# Patient Record
Sex: Female | Born: 1975 | Race: Black or African American | Hispanic: No | Marital: Married | Smoking: Never smoker
Health system: Southern US, Community
[De-identification: ages and names within clinical notes are randomized; demographics above are authoritative.]

## PROBLEM LIST (undated history)

## (undated) DIAGNOSIS — J45909 Unspecified asthma, uncomplicated: Secondary | ICD-10-CM

## (undated) DIAGNOSIS — T7840XA Allergy, unspecified, initial encounter: Secondary | ICD-10-CM

## (undated) HISTORY — PX: CHOLECYSTECTOMY: SHX55

## (undated) HISTORY — PX: TUBAL LIGATION: SHX77

## (undated) HISTORY — DX: Allergy, unspecified, initial encounter: T78.40XA

---

## 2014-04-25 HISTORY — PX: CHOLECYSTECTOMY: SHX55

## 2015-04-26 DIAGNOSIS — O24419 Gestational diabetes mellitus in pregnancy, unspecified control: Secondary | ICD-10-CM

## 2015-04-26 HISTORY — DX: Gestational diabetes mellitus in pregnancy, unspecified control: O24.419

## 2015-10-04 ENCOUNTER — Encounter (HOSPITAL_BASED_OUTPATIENT_CLINIC_OR_DEPARTMENT_OTHER): Payer: Self-pay | Admitting: *Deleted

## 2015-10-04 ENCOUNTER — Emergency Department (HOSPITAL_BASED_OUTPATIENT_CLINIC_OR_DEPARTMENT_OTHER)
Admission: EM | Admit: 2015-10-04 | Discharge: 2015-10-05 | Disposition: A | Discharge status: Home / Self Care | Payer: Medicaid Other | Attending: Emergency Medicine | Admitting: Emergency Medicine

## 2015-10-04 DIAGNOSIS — Z3A19 19 weeks gestation of pregnancy: Secondary | ICD-10-CM | POA: Insufficient documentation

## 2015-10-04 DIAGNOSIS — R51 Headache: Secondary | ICD-10-CM | POA: Insufficient documentation

## 2015-10-04 DIAGNOSIS — R42 Dizziness and giddiness: Secondary | ICD-10-CM | POA: Diagnosis not present

## 2015-10-04 DIAGNOSIS — R519 Headache, unspecified: Secondary | ICD-10-CM

## 2015-10-04 DIAGNOSIS — O26892 Other specified pregnancy related conditions, second trimester: Secondary | ICD-10-CM | POA: Insufficient documentation

## 2015-10-04 DIAGNOSIS — R110 Nausea: Secondary | ICD-10-CM | POA: Diagnosis not present

## 2015-10-04 MED ORDER — ACETAMINOPHEN 500 MG PO TABS
1000.0000 mg | ORAL_TABLET | Freq: Once | ORAL | Status: AC
  Administered 2015-10-05: 1000 mg via ORAL
  Filled 2015-10-04: qty 2

## 2015-10-04 MED ORDER — METOCLOPRAMIDE HCL 5 MG/ML IJ SOLN
10.0000 mg | Freq: Once | INTRAMUSCULAR | Status: AC
Start: 2015-10-04 — End: 2015-10-04
  Administered 2015-10-04: 10 mg via INTRAVENOUS
  Filled 2015-10-04: qty 2

## 2015-10-04 MED ORDER — DIPHENHYDRAMINE HCL 50 MG/ML IJ SOLN
25.0000 mg | Freq: Once | INTRAMUSCULAR | Status: AC
  Administered 2015-10-04: 25 mg via INTRAVENOUS
  Filled 2015-10-04: qty 1

## 2015-10-04 NOTE — ED Notes (Signed)
Pt reports HA intermittently x 1 week.  Pt is [redacted] weeks pregnant-healthy pregnancy.

## 2015-10-04 NOTE — ED Notes (Signed)
Pt is [redacted] weeks pregnant, no issues with this pregnancy at present time.

## 2015-10-04 NOTE — ED Provider Notes (Signed)
CSN: [id number]     Arrival date & time 10/04/15  1950 History  By signing my name below, I, Soijett Blue, attest that this documentation has been prepared under the direction and in the presence of Tilden Fossa, MD. Electronically Signed: Soijett Blue, ED Scribe. 10/04/2015. 9:45 PM.   Chief Complaint  Patient presents with  . Migraine      The history is provided by the patient. No language interpreter was used.    Courtney Medina is a 40 y.o. female who presents to the Emergency Department complaining of intermittent frontal HA onset 1 week worsening today. She notes that this HA is similar to HA that she has had when she was younger. Pt states that she is [redacted] weeks pregnant and that she has had no issues with her pregnancy besides nausea. Pt notes that this is her third pregnancy and she didn't have any issues with HA in her recent pregnancies. Pt is having associated symptoms of dizziness, blurred vision, photophobia, nausea due to pregnancy, and sinus pressure. She notes that she has tried tylenol with no relief of her symptoms. She denies fever, vomiting, vaginal bleeding, dysuria, nasal congestion, rhinorrhea, and any other symptoms.     History reviewed. No pertinent past medical history. Past Surgical History  Procedure Laterality Date  . Cholecystectomy     History reviewed. No pertinent family history. Social History  Substance Use Topics  . Smoking status: Never Smoker   . Smokeless tobacco: None  . Alcohol Use: No   OB History    Gravida Para Term Preterm AB TAB SAB Ectopic Multiple Living   1              Review of Systems  Constitutional: Negative for fever.  HENT: Positive for sinus pressure. Negative for congestion and rhinorrhea.   Eyes: Positive for photophobia and visual disturbance (blurred vision).  Gastrointestinal: Positive for nausea. Negative for vomiting.  Genitourinary: Negative for dysuria and vaginal bleeding.  Neurological: Positive for dizziness  and headaches.  All other systems reviewed and are negative.     Allergies  Penicillins and Zithromax  Home Medications   Prior to Admission medications   Not on File   BP 92/63 mmHg  Pulse 91  Temp(Src) 98.2 F (36.8 C) (Oral)  Resp 20  Ht  (1.473 m)  Wt 190 lb (86.183 kg)  BMI 39.72 kg/m2  SpO2 98% Physical Exam  Constitutional: She is oriented to person, place, and time. She appears well-developed and well-nourished.  HENT:  Head: Normocephalic and atraumatic.  Eyes: EOM are normal. Pupils are equal, round, and reactive to light.  photophobia  Cardiovascular: Normal rate and regular rhythm.   No murmur heard. Pulmonary/Chest: Effort normal and breath sounds normal. No respiratory distress.  Abdominal: Soft. There is no tenderness. There is no rebound and no guarding.  Musculoskeletal: She exhibits no edema or tenderness.  Neurological: She is alert and oriented to person, place, and time. No cranial nerve deficit.  Skin: Skin is warm and dry.  Psychiatric: She has a normal mood and affect. Her behavior is normal.  Nursing note and vitals reviewed.   ED Course  Procedures (including critical care time) DIAGNOSTIC STUDIES: Oxygen Saturation is 100% on RA, nl by my interpretation.    COORDINATION OF CARE: 9:44 PM Discussed treatment plan with pt at bedside which includes reglan and benadryl and pt agreed to plan.    Labs Review Labs Reviewed - No data to display  Imaging  Review No results found.    EKG Interpretation None      MDM   Final diagnoses:  Bad headache   Pt here for evaluation of headache.  She is nontoxic appearing on examination with no focal neurologic deficits.  Presentation not c/w SAH, CVA, meningitis.  She is partially improved following headache cocktail in the ED and declines additional treatment.  Plan to d/c home with outpatient follow up.  Return precautions discussed.   I personally performed the services described in  this documentation, which was scribed in my presence. The recorded information has been reviewed and is accurate.    Tilden FossaElizabeth Adhvik Canady, MD 10/05/15 1122

## 2015-10-04 NOTE — Discharge Instructions (Signed)

## 2015-10-05 NOTE — ED Notes (Signed)
Pt given d/c instructions as per chart. Verbalizes understanding. No questions. 

## 2019-02-26 ENCOUNTER — Emergency Department (HOSPITAL_BASED_OUTPATIENT_CLINIC_OR_DEPARTMENT_OTHER): Payer: Medicaid Other

## 2019-02-26 ENCOUNTER — Emergency Department (HOSPITAL_BASED_OUTPATIENT_CLINIC_OR_DEPARTMENT_OTHER)
Admission: EM | Admit: 2019-02-26 | Discharge: 2019-02-26 | Disposition: A | Discharge status: Home / Self Care | Payer: Medicaid Other | Attending: Emergency Medicine | Admitting: Emergency Medicine

## 2019-02-26 ENCOUNTER — Other Ambulatory Visit: Payer: Self-pay

## 2019-02-26 ENCOUNTER — Encounter (HOSPITAL_BASED_OUTPATIENT_CLINIC_OR_DEPARTMENT_OTHER): Payer: Self-pay

## 2019-02-26 DIAGNOSIS — R519 Headache, unspecified: Secondary | ICD-10-CM | POA: Insufficient documentation

## 2019-02-26 DIAGNOSIS — R42 Dizziness and giddiness: Secondary | ICD-10-CM | POA: Diagnosis not present

## 2019-02-26 DIAGNOSIS — R531 Weakness: Secondary | ICD-10-CM | POA: Diagnosis present

## 2019-02-26 DIAGNOSIS — J45909 Unspecified asthma, uncomplicated: Secondary | ICD-10-CM | POA: Diagnosis not present

## 2019-02-26 HISTORY — DX: Unspecified asthma, uncomplicated: J45.909

## 2019-02-26 LAB — CBC WITH DIFFERENTIAL/PLATELET
Abs Immature Granulocytes: 0.03 10*3/uL (ref 0.00–0.07)
Basophils Absolute: 0 10*3/uL (ref 0.0–0.1)
Basophils Relative: 0 %
Eosinophils Absolute: 0.2 10*3/uL (ref 0.0–0.5)
Eosinophils Relative: 2 %
HCT: 37.2 % (ref 36.0–46.0)
Hemoglobin: 11.3 g/dL — ABNORMAL LOW (ref 12.0–15.0)
Immature Granulocytes: 1 %
Lymphocytes Relative: 27 %
Lymphs Abs: 1.8 10*3/uL (ref 0.7–4.0)
MCH: 27.7 pg (ref 26.0–34.0)
MCHC: 30.4 g/dL (ref 30.0–36.0)
MCV: 91.2 fL (ref 80.0–100.0)
Monocytes Absolute: 0.5 10*3/uL (ref 0.1–1.0)
Monocytes Relative: 8 %
Neutro Abs: 4.1 10*3/uL (ref 1.7–7.7)
Neutrophils Relative %: 62 %
Platelets: 248 10*3/uL (ref 150–400)
RBC: 4.08 MIL/uL (ref 3.87–5.11)
RDW: 12.9 % (ref 11.5–15.5)
WBC: 6.6 10*3/uL (ref 4.0–10.5)
nRBC: 0 % (ref 0.0–0.2)

## 2019-02-26 LAB — BASIC METABOLIC PANEL
Anion gap: 9 (ref 5–15)
BUN: 8 mg/dL (ref 6–20)
CO2: 24 mmol/L (ref 22–32)
Calcium: 8.7 mg/dL — ABNORMAL LOW (ref 8.9–10.3)
Chloride: 105 mmol/L (ref 98–111)
Creatinine, Ser: 0.46 mg/dL (ref 0.44–1.00)
GFR calc Af Amer: >60 mL/min (ref >60)
GFR calc non Af Amer: >60 mL/min (ref >60)
Glucose, Bld: 97 mg/dL (ref 70–99)
Potassium: 3.5 mmol/L (ref 3.5–5.1)
Sodium: 138 mmol/L (ref 135–145)

## 2019-02-26 NOTE — ED Notes (Signed)
Pt lying flat for orthostatics 

## 2019-02-26 NOTE — ED Notes (Signed)
Pts IV in right AC pulled out by pt while she removed shirt.

## 2019-02-26 NOTE — ED Notes (Signed)
Patient ambulatory to CT

## 2019-02-26 NOTE — ED Notes (Signed)
ED Provider at bedside. 

## 2019-02-26 NOTE — ED Provider Notes (Signed)
[facility] EMERGENCY DEPARTMENT Provider Note   CSN: [id number] Arrival date & time: 02/26/19  1528     History   Chief Complaint Chief Complaint  Patient presents with  . Weakness    HPI Courtney Medina is a 43 y.o. female.     Patient is a 43 year old female with past medical history of asthma presenting to the emergency department for multiple complaints which have been going on for the past week.  Patient reports that she has been feeling some sort of odd sensation in the right side of her head and into her right arm.  Reports that she has been feeling weak in her right arm especially when she is trying to pick up her son.  Reports that occasionally she feels both lightheaded like she is going to pass out but also dizzy when she is trying to walk.  Reports she feels like her distance vision might be getting a little bit worse than it was before but denies any diplopia, flashes, floaters or vision loss.  Reports having a slight headache but mostly just in unpleasant sensation in the right side of her face.  Denies any slurred speech, dysphagia, facial asymmetry.  She notes that her father died of ALS at age 43.  She reports that she is not sure if she is having any symptoms in her legs or not.  She denies any cough, fever, shortness of breath, nausea, vomiting, diarrhea.     Past Medical History:  Diagnosis Date  . Asthma   . Gestational diabetes   . Gestational diabetes 2017    There are no active problems to display for this patient.   Past Surgical History:  Procedure Laterality Date  . CHOLECYSTECTOMY       OB History    Gravida  1   Para      Term      Preterm      AB      Living        SAB      TAB      Ectopic      Multiple      Live Births               Home Medications    Prior to Admission medications   Not on File    Family History History reviewed. No pertinent family history.  Social History Social History    Tobacco Use  . Smoking status: Never Smoker  . Smokeless tobacco: Never Used  Substance Use Topics  . Alcohol use: Yes    Comment: occ  . Drug use: No     Allergies   Penicillins and Zithromax [azithromycin]   Review of Systems Review of Systems  Constitutional: Positive for fatigue. Negative for appetite change and fever.  HENT: Negative for congestion and sore throat.   Eyes: Negative for photophobia, pain, redness and itching.  Respiratory: Negative for cough and shortness of breath.   Cardiovascular: Negative for chest pain, palpitations and leg swelling.  Gastrointestinal: Negative for abdominal pain, constipation and vomiting.  Genitourinary: Negative for dysuria, flank pain, menstrual problem and pelvic pain.  Musculoskeletal: Negative for back pain.  Skin: Negative for rash and wound.  Neurological: Positive for dizziness, weakness, light-headedness, numbness and headaches. Negative for tremors, seizures, syncope, facial asymmetry and speech difficulty.     Physical Exam Updated Vital Signs BP 108/69 (BP Location: Right Arm)   Pulse 84   Temp 98.9 F (37.2 C) (Oral)  Resp 14   Ht 4\' 10"  (1.473 m)   Wt 79.4 kg   SpO2 100%   Breastfeeding Unknown   BMI 36.58 kg/m   Physical Exam Vitals signs and nursing note reviewed.  Constitutional:      General: She is not in acute distress.    Appearance: Normal appearance. She is not ill-appearing or diaphoretic.  HENT:     Head: Normocephalic.     Nose: Nose normal.     Mouth/Throat:     Mouth: Mucous membranes are moist.  Eyes:     General: No visual field deficit.    Conjunctiva/sclera: Conjunctivae normal.  Cardiovascular:     Rate and Rhythm: Normal rate and regular rhythm.     Pulses: Normal pulses.  Pulmonary:     Effort: Pulmonary effort is normal.  Abdominal:     General: Abdomen is flat. Bowel sounds are normal.  Skin:    General: Skin is dry.  Neurological:     General: No focal deficit  present.     Mental Status: She is alert and oriented to person, place, and time.     Cranial Nerves: Cranial nerves are intact. No cranial nerve deficit, dysarthria or facial asymmetry.     Sensory: Sensation is intact. No sensory deficit.     Motor: Motor function is intact. No weakness, tremor, atrophy or pronator drift.     Coordination: Coordination normal. Finger-Nose-Finger Test and Heel to [place] Test normal.     Gait: Gait is intact. Gait and tandem walk normal.  Psychiatric:        Mood and Affect: Mood normal.      ED Treatments / Results  Labs (all labs ordered are listed, but only abnormal results are displayed) Labs Reviewed  CBC WITH DIFFERENTIAL/PLATELET - Abnormal; Notable for the following components:      Result Value   Hemoglobin 11.3 (*)    All other components within normal limits  BASIC METABOLIC PANEL - Abnormal; Notable for the following components:   Calcium 8.7 (*)    All other components within normal limits  URINALYSIS, ROUTINE W REFLEX MICROSCOPIC  PREGNANCY, URINE    EKG EKG Interpretation  Date/Time:  Tuesday February 26 2019 17:52:57 EST Ventricular Rate:  76 PR Interval:    QRS Duration: 92 QT Interval:  376 QTC Calculation: 423 R Axis:   29 Text Interpretation: Sinus rhythm Low voltage, precordial leads Confirmed by 03-15-1980 [phone]) on 02/26/2019 6:13:27 PM   Radiology Dg Chest 2 View  Result Date: 02/26/2019 CLINICAL DATA:  Weakness and dizziness for 1 week EXAM: CHEST - 2 VIEW COMPARISON:  None. FINDINGS: The cardiac silhouette, mediastinal and hilar contours are within normal limits. The lungs are clear. No pleural effusions. The bony thorax is intact. IMPRESSION: No acute cardiopulmonary findings. Electronically Signed   By: 13/06/2018 M.D.   On: 02/26/2019 17:55   Ct Head Wo Contrast  Result Date: 02/26/2019 CLINICAL DATA:  Head pressure radiating down the right arm EXAM: CT HEAD WITHOUT CONTRAST TECHNIQUE: Contiguous axial  images were obtained from the base of the skull through the vertex without intravenous contrast. COMPARISON:  None. FINDINGS: Brain: No evidence of acute infarction, hemorrhage, hydrocephalus, extra-axial collection or mass lesion/mass effect. Vascular: No hyperdense vessel or unexpected calcification. Skull: Normal. Negative for fracture or focal lesion. Sinuses/Orbits: No acute finding. Other: None IMPRESSION: Negative non contrasted CT appearance of the brain Electronically Signed   By: 13/06/2018 M.D.   On:  02/26/2019 17:43    Procedures Procedures (including critical care time)  Medications Ordered in ED Medications - No data to display   Initial Impression / Assessment and Plan / ED Course  I have reviewed the triage vital signs and the nursing notes.  Pertinent labs & imaging results that were available during my care of the patient were reviewed by me and considered in my medical decision making (see chart for details).  Clinical Course as of Feb 25 1854  Tue Feb 26, 2019  1852 Patient seen today for multiple complaints over the last week or so including headache, arm weakness, dizziness, lightheadedness.  Neuro exam today reveals normal gait and otherwise unremarkable findings.  Labs, EKG, orthostatics, head CT, chest x-ray are all unremarkable as well.  Discussed the case with Dr. Ronnald Nian who agrees patient okay for discharge with no need for further imaging in the emergency department at this time.  I do think that she should follow-up with neurology given she has had some of the symptoms in the past before.  Patient in agreement with the plan.   [KM]    Clinical Course User Index [KM] Alveria Apley, PA-C       Based on review of vitals, medical screening exam, lab work and/or imaging, there does not appear to be an acute, emergent etiology for the patient's symptoms. Counseled pt on good return precautions and encouraged both PCP and ED follow-up as needed.  Prior to  discharge, I also discussed incidental imaging findings with patient in detail and advised appropriate, recommended follow-up in detail.  Clinical Impression: 1. Weakness   2. Dizziness     Disposition: Discharge  Prior to providing a prescription for a controlled substance, I independently reviewed the patient's recent prescription history on the [facility]. The patient had no recent or regular prescriptions and was deemed appropriate for a brief, less than 3 day prescription of narcotic for acute analgesia.  This note was prepared with assistance of Systems analyst. Occasional wrong-word or sound-a-like substitutions may have occurred due to the inherent limitations of voice recognition software.   Final Clinical Impressions(s) / ED Diagnoses   Final diagnoses:  Weakness  Dizziness    ED Discharge Orders    None       Alveria Apley, PA-C 02/26/19 1855    Lennice Sites, DO 02/26/19 2048

## 2019-02-26 NOTE — ED Triage Notes (Signed)
Pt states head pressure that radiates down right arm, causing it to feel heavy and weak on and off x1week. Some dizziness and feelings of 'just being off', blurry vision, fell 3 xdays ago due to same. No headache, LOC, denies injury. A&Ox4.

## 2019-02-26 NOTE — Discharge Instructions (Addendum)
You are seen today for right-sided abnormal sensation and weakness.  Your lab work was reassuring.  Your head CT, chest x-ray and EKG were also reassuring.  I think that due to the symptoms ongoing and you having similar symptoms in the past you should get a follow-up with neurology for further work-up.  Otherwise you are safe to go home today and follow-up with your regular doctor as scheduled. Thank you for allowing me to care for you today. Please return to the emergency department if you have new or worsening symptoms. Take your medications as instructed.

## 2019-04-08 ENCOUNTER — Ambulatory Visit: Payer: Medicaid Other | Admitting: Neurology

## 2019-04-08 ENCOUNTER — Encounter: Payer: Self-pay | Admitting: *Deleted

## 2019-06-04 ENCOUNTER — Ambulatory Visit: Payer: Medicaid Other | Admitting: Diagnostic Neuroimaging

## 2019-06-04 ENCOUNTER — Other Ambulatory Visit: Payer: Self-pay

## 2019-06-04 ENCOUNTER — Encounter: Payer: Self-pay | Admitting: Diagnostic Neuroimaging

## 2019-06-04 VITALS — BP 128/90 | HR 88 | Temp 98.1°F | Ht <= 58 in | Wt 186.0 lb

## 2019-06-04 DIAGNOSIS — G4719 Other hypersomnia: Secondary | ICD-10-CM

## 2019-06-04 DIAGNOSIS — R531 Weakness: Secondary | ICD-10-CM | POA: Diagnosis not present

## 2019-06-04 NOTE — Progress Notes (Signed)
[facility]  PATIENT: Courtney Medina DOB: 1975-10-27  REFERRING CLINICIAN: Piedad Climes, [place] E, * HISTORY FROM: patient  REASON FOR VISIT: new consult    HISTORICAL  CHIEF COMPLAINT:  Chief Complaint  Patient presents with  . Ataxia, muscle weakness    rm 7 New Pt "muscle weakness x 3 months off and on, recent weight gain 30 lbs; I feel better now"    HISTORY OF PRESENT ILLNESS:   44 year old female here for evaluation of intermittent weakness, confusion and fatigue.  Patient has had intermittent episodes lasting for hours, days or weeks at a time of incoordination, right arm heaviness, confusion, fatigue.  Patient had an episode recently in October 2020 right before she went on vacation to [place].  First episodes occurred around age 43 years old.  Patient has had these intermittent episodes over the years.  She has had 2 neurologic evaluations in the past with negative work-up.  First work-up was in [place] and second work-up was in [facility].  Patient's father had diagnosis of ALS at age 22 years old and passed away at age 42 years old.  Patient was concerned about onset of similar symptoms however patient symptoms have always resolved after some time.  Patient denies any significant stress or sleep disturbances.    REVIEW OF SYSTEMS: Full 14 system review of systems performed and negative with exception of: As per HPI.  ALLERGIES: Allergies  Allergen Reactions  . Erythromycin Nausea Only  . Penicillins Rash    HOME MEDICATIONS: Outpatient Medications Prior to Visit  Medication Sig Dispense Refill  . amphetamine-dextroamphetamine (ADDERALL) 30 MG tablet Take 30 mg by mouth daily.    . cetirizine (ZYRTEC) 10 MG tablet Take 10 mg by mouth daily.    . diphenhydrAMINE (BENADRYL) 12.5 MG chewable tablet Chew 12.5 mg by mouth 4 (four) times daily as needed for allergies. 1-2 tabs as needed     No facility-administered medications prior  to visit.    PAST MEDICAL HISTORY: Past Medical History:  Diagnosis Date  . Allergies   . Asthma   . Gestational diabetes   . Gestational diabetes 2017    PAST SURGICAL HISTORY: Past Surgical History:  Procedure Laterality Date  . CESAREAN SECTION     x 3  . CHOLECYSTECTOMY  2016    FAMILY HISTORY: Family History  Problem Relation Age of Onset  . Breast cancer Mother   . Asthma Mother   . Eczema Mother   . Allergies Mother   . ALS Father   . Diabetes Other   . Heart disease Other   . Hyperlipidemia Other   . Hypertension Other   . Allergic rhinitis Other   . Asthma Other   . Eczema Other   . Urticaria Other   . Allergies Other   . Breast cancer Maternal Aunt   . Allergic rhinitis Child   . Asthma Child   . Eczema Child   . Allergies Child   . Cancer Maternal Grandfather   . Emphysema Paternal Grandmother   . Diabetes Paternal Grandfather   . Heart disease Paternal Grandfather     SOCIAL HISTORY: Social History   Socioeconomic History  . Marital status: Married    Spouse name: Loraine Leriche  . Number of children: 3  . Years of education: Not on file  . 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  Smoking status: Never Smoker  . Smokeless tobacco: Never Used  Substance and Sexual Activity  . Alcohol use: Yes    Comment: occ  . Drug use: No  . Sexual activity: Not on file  Other Topics Concern  . Not on file  Social History Narrative   Lives with family   Caffeine- coffee 1 daily   Social Determinants of Health   Financial Resource Strain:   . Difficulty of Paying Living Expenses: Not on file  Food Insecurity:   . Worried About Programme researcher, broadcasting/film/video in the Last Year: Not on file  . Ran Out of Food in the Last Year: Not on file  Transportation Needs:   . Lack of Transportation (Medical): Not on file  . Lack of Transportation (Non-Medical): Not on file    Physical Activity:   . Days of Exercise per Week: Not on file  . Minutes of Exercise per Session: Not on file  Stress:   . Feeling of Stress : Not on file  Social Connections:   . Frequency of Communication with Friends and Family: Not on file  . Frequency of Social Gatherings with Friends and Family: Not on file  . Attends Religious Services: Not on file  . Active Member of Clubs or Organizations: Not on file  . Attends Banker Meetings: Not on file  . Marital Status: Not on file  Intimate Partner Violence:   . Fear of Current or Ex-Partner: Not on file  . Emotionally Abused: Not on file  . Physically Abused: Not on file  . Sexually Abused: Not on file     PHYSICAL EXAM  GENERAL EXAM/CONSTITUTIONAL: Vitals:  Vitals:   06/04/19 1541  BP: 128/90  Pulse: 88  Temp: 98.1 F (36.7 C)  Weight: 186 lb (84.4 kg)  Height: 4\' 10"  (1.473 m)     Body mass index is 38.87 kg/m. Wt Readings from Last 3 Encounters:  06/04/19 186 lb (84.4 kg)  02/26/19 175 lb (79.4 kg)  10/04/15 190 lb (86.2 kg)     Patient is in no distress; well developed, nourished and groomed; neck is supple  CARDIOVASCULAR:  Examination of carotid arteries is normal; no carotid bruits  Regular rate and rhythm, no murmurs  Examination of peripheral vascular system by observation and palpation is normal  EYES:  Ophthalmoscopic exam of optic discs and posterior segments is normal; no papilledema or hemorrhages  No exam data present  MUSCULOSKELETAL:  Gait, strength, tone, movements noted in Neurologic exam below  NEUROLOGIC: MENTAL STATUS:  No flowsheet data found.  awake, alert, oriented to person, place and time  recent and remote memory intact  normal attention and concentration  language fluent, comprehension intact, naming intact  fund of knowledge appropriate  CRANIAL NERVE:   2nd - no papilledema on fundoscopic exam  2nd, 3rd, 4th, 6th - pupils equal and  reactive to light, visual fields full to confrontation, extraocular muscles intact, no nystagmus  5th - facial sensation symmetric  7th - facial strength symmetric  8th - hearing intact  9th - palate elevates symmetrically, uvula midline  11th - shoulder shrug symmetric  12th - tongue protrusion midline  MOTOR:   normal bulk and tone, full strength in the BUE, BLE  SENSORY:   normal and symmetric to light touch, temperature, vibration  COORDINATION:   finger-nose-finger, fine finger movements normal  REFLEXES:   deep tendon reflexes present and symmetric  GAIT/STATION:   narrow based gait  DIAGNOSTIC DATA (LABS, IMAGING, TESTING) - I reviewed patient records, labs, notes, testing and imaging myself where available.  Lab Results  Component Value Date   WBC 6.6 02/26/2019   HGB 11.3 (L) 02/26/2019   HCT 37.2 02/26/2019   MCV 91.2 02/26/2019   PLT 248 02/26/2019      Component Value Date/Time   NA 138 02/26/2019 1711   K 3.5 02/26/2019 1711   CL 105 02/26/2019 1711   CO2 24 02/26/2019 1711   GLUCOSE 97 02/26/2019 1711   BUN 8 02/26/2019 1711   CREATININE 0.46 02/26/2019 1711   CALCIUM 8.7 (L) 02/26/2019 1711   GFRNONAA >60 02/26/2019 1711   GFRAA >60 02/26/2019 1711   No results found for: CHOL, HDL, LDLCALC, LDLDIRECT, TRIG, CHOLHDL No results found for: HGBA1C No results found for: VITAMINB12 No results found for: TSH   03/19/19 MRI brain  - normal    ASSESSMENT AND PLAN  44 y.o. year old female here with intermittent episodes of weakness, fatigue, confusion, heaviness on the right side, since age 66 years old, with episodes lasting days or weeks at a time and fully resolving.  We will proceed with further work-up to rule out other metabolic, autoimmune, inflammatory etiologies.  Also will set up a sleep study to rule out OSA or other parasomnia.   Dx:  1. Weakness   2. Excessive daytime sleepiness     PLAN:  INTERMITTENT WEAKNESS,  FATIGUE, CONFUSION - check labs and MRI cervical spine - check sleep study  Orders Placed This Encounter  Procedures  . MR CERVICAL SPINE W WO CONTRAST  . AChR Abs with Reflex to MuSK  . Neuromyelitis optica autoab, IgG  . CBC with Differential/Platelet  . Comprehensive metabolic panel  . Hemoglobin A1c  . Vitamin B12  . CK  . Aldolase  . Ambulatory referral to Sleep Studies   Return for pending if symptoms worsen or fail to improve.    Penni Bombard, MD 0/12/9831, 8:25 PM Certified in Neurology, Neurophysiology and Neuroimaging  [facility] [address] [place], [place] [address] [phone] a

## 2019-06-05 ENCOUNTER — Telehealth: Payer: Self-pay | Admitting: Diagnostic Neuroimaging

## 2019-06-05 NOTE — Telephone Encounter (Signed)
medicaid order sent to [facility]. They will obtain the auth and reach out to the patient to schedule.  °

## 2019-06-13 ENCOUNTER — Institutional Professional Consult (permissible substitution): Payer: Medicaid Other | Admitting: Neurology

## 2019-06-19 LAB — CBC WITH DIFFERENTIAL/PLATELET
Basophils Absolute: 0 10*3/uL (ref 0.0–0.2)
Basos: 1 %
EOS (ABSOLUTE): 0.2 10*3/uL (ref 0.0–0.4)
Eos: 3 %
Hematocrit: 35.2 % (ref 34.0–46.6)
Hemoglobin: 11.5 g/dL (ref 11.1–15.9)
Immature Grans (Abs): 0 10*3/uL (ref 0.0–0.1)
Immature Granulocytes: 0 %
Lymphocytes Absolute: 2.5 10*3/uL (ref 0.7–3.1)
Lymphs: 34 %
MCH: 28 pg (ref 26.6–33.0)
MCHC: 32.7 g/dL (ref 31.5–35.7)
MCV: 86 fL (ref 79–97)
Monocytes Absolute: 0.6 10*3/uL (ref 0.1–0.9)
Monocytes: 8 %
Neutrophils Absolute: 4 10*3/uL (ref 1.4–7.0)
Neutrophils: 54 %
Platelets: 246 10*3/uL (ref 150–450)
RBC: 4.1 x10E6/uL (ref 3.77–5.28)
RDW: 12.4 % (ref 11.7–15.4)
WBC: 7.3 10*3/uL (ref 3.4–10.8)

## 2019-06-19 LAB — COMPREHENSIVE METABOLIC PANEL
ALT: 8 IU/L (ref 0–32)
AST: 11 IU/L (ref 0–40)
Albumin/Globulin Ratio: 1.4 (ref 1.2–2.2)
Albumin: 3.9 g/dL (ref 3.8–4.8)
Alkaline Phosphatase: 120 IU/L — ABNORMAL HIGH (ref 39–117)
BUN/Creatinine Ratio: 13 (ref 9–23)
BUN: 7 mg/dL (ref 6–24)
Bilirubin Total: <0.2 mg/dL (ref 0.0–1.2)
CO2: 24 mmol/L (ref 20–29)
Calcium: 9.5 mg/dL (ref 8.7–10.2)
Chloride: 103 mmol/L (ref 96–106)
Creatinine, Ser: 0.56 mg/dL — ABNORMAL LOW (ref 0.57–1.00)
GFR calc Af Amer: 132 mL/min/{1.73_m2} (ref >59)
GFR calc non Af Amer: 115 mL/min/{1.73_m2} (ref >59)
Globulin, Total: 2.8 g/dL (ref 1.5–4.5)
Glucose: 100 mg/dL — ABNORMAL HIGH (ref 65–99)
Potassium: 4.3 mmol/L (ref 3.5–5.2)
Sodium: 141 mmol/L (ref 134–144)
Total Protein: 6.7 g/dL (ref 6.0–8.5)

## 2019-06-19 LAB — VITAMIN B12: Vitamin B-12: 372 pg/mL (ref 232–1245)

## 2019-06-19 LAB — HEMOGLOBIN A1C
Est. average glucose Bld gHb Est-mCnc: 117 mg/dL
Hgb A1c MFr Bld: 5.7 % — ABNORMAL HIGH (ref 4.8–5.6)

## 2019-06-19 LAB — CK: Total CK: 76 U/L (ref 32–182)

## 2019-06-19 LAB — MUSK ANTIBODIES: MuSK Antibodies: <1 U/mL

## 2019-06-19 LAB — ACHR ABS WITH REFLEX TO MUSK: AChR Binding Ab, Serum: 0.04 nmol/L (ref 0.00–0.24)

## 2019-06-19 LAB — ALDOLASE: Aldolase: 3.5 U/L (ref 3.3–10.3)

## 2019-06-19 LAB — NEUROMYELITIS OPTICA AUTOAB, IGG: NMO IgG Autoantibodies: <1.5 U/mL (ref 0.0–3.0)

## 2019-06-20 ENCOUNTER — Ambulatory Visit: Payer: Medicaid Other | Admitting: Neurology

## 2019-06-20 ENCOUNTER — Other Ambulatory Visit: Payer: Self-pay

## 2019-06-20 ENCOUNTER — Encounter: Payer: Self-pay | Admitting: Neurology

## 2019-06-20 VITALS — BP 116/80 | HR 97 | Temp 97.7°F | Ht <= 58 in | Wt 181.0 lb

## 2019-06-20 DIAGNOSIS — R0683 Snoring: Secondary | ICD-10-CM

## 2019-06-20 DIAGNOSIS — G4719 Other hypersomnia: Secondary | ICD-10-CM | POA: Diagnosis not present

## 2019-06-20 DIAGNOSIS — G4733 Obstructive sleep apnea (adult) (pediatric): Secondary | ICD-10-CM

## 2019-06-20 DIAGNOSIS — E669 Obesity, unspecified: Secondary | ICD-10-CM

## 2019-06-20 DIAGNOSIS — R519 Headache, unspecified: Secondary | ICD-10-CM

## 2019-06-20 NOTE — Patient Instructions (Signed)

## 2019-06-20 NOTE — Progress Notes (Signed)
Subjective:    Patient ID: Courtney Medina is a 44 y.o. female.  HPI     Huston Foley, MD, PhD [facility] [address] [address] [place], [place] [address]   Dear Satira Sark,   I saw your patient, Courtney Medina, upon your kind request in my sleep clinic today for initial consultation of her sleep disorder, in particular, concern for underlying obstructive sleep apnea.  The patient is unaccompanied today.  As you know, Courtney Medina is a 44 year old right-handed woman with an underlying medical history of allergies, asthma, and obesity, who reports daytime somnolence. I reviewed the office note from 06/04/2019.  Her Epworth sleepiness score is 9 out of 24, fatigue severity score is 31 out of 63.  She reports that she had sleep study testing on 2 occasions in the past but was never advised to start CPAP therapy.  One study was in [place] and [place] in [place].  Her last study in 2014 and neurology did show some sleep apnea but she believes it was mild and she had the option of trying CPAP.  She has gained weight in the realm of 30 pounds in the past year since the pandemic she admits.  She has been working on weight loss.  She works as Printmaker.  She has 44-year-old who cosleeps with her, her husband often sleeps in a different bedroom.  She has an 77-year-old and a 44 year old as well.  She has a 72 year old stepson that stays with him partially.  She is not aware of any family history of OSA.  Her father has ALS, her mother does snore.  She has woken up with a headache at times, she does not have night to night nocturia, she has to be up at 430, she has a 35-minute commute one way, bedtime is around 830.  Her husband has noted her snoring.  She has had some shortness of breath at times.  She drinks caffeine in the form of coffee, 1 cup/day on average, occasional tea, no soda, she does not smoke, she drinks alcohol occasionally. Her Past Medical History Is  Significant For: Past Medical History:  Diagnosis Date  . Allergies   . Asthma   . Gestational diabetes   . Gestational diabetes 2017    Her Past Surgical History Is Significant For: Past Surgical History:  Procedure Laterality Date  . CESAREAN SECTION     x 3  . CHOLECYSTECTOMY  2016    Her Family History Is Significant For: Family History  Problem Relation Age of Onset  . Breast cancer Mother   . Asthma Mother   . Eczema Mother   . Allergies Mother   . ALS Father   . Diabetes Other   . Heart disease Other   . Hyperlipidemia Other   . Hypertension Other   . Allergic rhinitis Other   . Asthma Other   . Eczema Other   . Urticaria Other   . Allergies Other   . Breast cancer Maternal Aunt   . Allergic rhinitis Child   . Asthma Child   . Eczema Child   . Allergies Child   . Cancer Maternal Grandfather   . Emphysema Paternal Grandmother   . Diabetes Paternal Grandfather   . Heart disease Paternal Grandfather     Her Social History Is Significant For: Social History   Socioeconomic History  . Marital status: Married    Spouse name: Loraine Leriche  . Number of children: 3  .  Years of education: Not on file  . 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 Smoking status: Never Smoker  . Smokeless tobacco: Never Used  Substance and Sexual Activity  . Alcohol use: Yes    Comment: occ  . Drug use: No  . Sexual activity: Not on file  Other Topics Concern  . Not on file  Social History Narrative   Lives with family   Caffeine- coffee 1 daily   Social Determinants of Health   Financial Resource Strain:   . Difficulty of Paying Living Expenses: Not on file  Food Insecurity:   . Worried About Charity fundraiser in the Last Year: Not on file  . Ran Out of Food in the Last Year: Not on file  Transportation Needs:   . Lack of Transportation (Medical): Not on file  .  Lack of Transportation (Non-Medical): Not on file  Physical Activity:   . Days of Exercise per Week: Not on file  . Minutes of Exercise per Session: Not on file  Stress:   . Feeling of Stress : Not on file  Social Connections:   . Frequency of Communication with Friends and Family: Not on file  . Frequency of Social Gatherings with Friends and Family: Not on file  . Attends Religious Services: Not on file  . Active Member of Clubs or Organizations: Not on file  . Attends Archivist Meetings: Not on file  . Marital Status: Not on file    Her Allergies Are:  Allergies  Allergen Reactions  . Erythromycin Nausea Only  . Penicillins Rash  :   Her Current Medications Are:  Outpatient Encounter Medications as of 06/20/2019  Medication Sig  . amphetamine-dextroamphetamine (ADDERALL) 30 MG tablet Take 30 mg by mouth daily.  . cetirizine (ZYRTEC) 10 MG tablet Take 10 mg by mouth daily.  . diphenhydrAMINE (BENADRYL) 12.5 MG chewable tablet Chew 12.5 mg by mouth 4 (four) times daily as needed for allergies. 1-2 tabs as needed   No facility-administered encounter medications on file as of 06/20/2019.  :  Review of Systems:  Out of a complete 14 point review of systems, all are reviewed and negative with the exception of these symptoms as listed below:  Review of Systems  Neurological:       Here for sleep study consult. Per pt 2 prior sleep studies at that point no pap was recommended. Epworth Sleepiness Scale 0= would never doze 1= slight chance of dozing 2= moderate chance of dozing 3= high chance of dozing  Sitting and reading:2 Watching TV:3 Sitting inactive in a public place (ex. Theater or meeting):1 As a passenger in a car for an hour without a break:3 Lying down to rest in the afternoon:0 Sitting and talking to someone:0 Sitting quietly after lunch (no alcohol):0 In a car, while stopped in traffic:0 Total:9     Objective:  Neurological Exam  Physical  Exam Physical Examination:   Vitals:   06/20/19 1603  BP: 116/80  Pulse: 97  Temp: 97.7 F (36.5 C)  SpO2: 98%    General Examination: The patient is a very pleasant 44 y.o. female in no acute distress. She appears well-developed and well-nourished and well groomed.   HEENT: Normocephalic, atraumatic, pupils are equal, round and reactive to light, extraocular tracking is good without limitation to gaze excursion or nystagmus noted. Hearing is grossly  intact. Face is symmetric with normal facial animation. Speech is clear with no dysarthria noted. There is no hypophonia. There is no lip, neck/head, jaw or voice tremor. Neck is supple with full range of passive and active motion. There are no carotid bruits on auscultation. Oropharynx exam reveals: mild mouth dryness, adequate dental hygiene and mild airway crowding, due to Small airway entry, uvula is slim but slightly elongated, tonsils small, neck circumference 14-1/8 inches, Mallampati class I.  Tongue protrudes centrally in palate elevates symmetrically.   Chest: Clear to auscultation without wheezing, rhonchi or crackles noted.  Heart: S1+S2+0, regular and normal without murmurs, rubs or gallops noted.   Abdomen: Soft, non-tender and non-distended with normal bowel sounds appreciated on auscultation.  Extremities: There is no pitting edema in the distal lower extremities bilaterally.   Skin: Warm and dry without trophic changes noted.   Musculoskeletal: exam reveals no obvious joint deformities, tenderness or joint swelling or erythema.   Neurologically:  Mental status: The patient is awake, alert and oriented in all 4 spheres. Her immediate and remote memory, attention, language skills and fund of knowledge are appropriate. There is no evidence of aphasia, agnosia, apraxia or anomia. Speech is clear with normal prosody and enunciation. Thought process is linear. Mood is normal and affect is normal.  Cranial nerves II - XII are as  described above under HEENT exam.  Motor exam: Normal bulk, strength and tone is noted. There is no tremor, Fine motor skills and coordination: grossly intact.  Cerebellar testing: No dysmetria or intention tremor. There is no truncal or gait ataxia.  Sensory exam: intact to light touch in the upper and lower extremities.  Gait, station and balance: She stands easily. No veering to one side is noted. No leaning to one side is noted. Posture is age-appropriate and stance is narrow based. Gait shows normal stride length and normal pace. No problems turning are noted.   Assessment and Plan:  In summary, Yulisa Chirico is a very pleasant 44 y.o.-year old female with an underlying medical history of allergies, asthma, and obesity, whose history and physical exam are concerning for obstructive sleep apnea (OSA). I had a long chat with the patient about my findings and the diagnosis of OSA, its prognosis and treatment options. We talked about medical treatments, surgical interventions and non-pharmacological approaches. I explained in particular the risks and ramifications of untreated moderate to severe OSA, especially with respect to developing cardiovascular disease down the Road, including congestive heart failure, difficult to treat hypertension, cardiac arrhythmias, or stroke. Even type 2 diabetes has, in part, been linked to untreated OSA. Symptoms of untreated OSA include daytime sleepiness, memory problems, mood irritability and mood disorder such as depression and anxiety, lack of energy, as well as recurrent headaches, especially morning headaches. We talked about trying to maintain a healthy lifestyle in general, as well as the importance of weight control. We also talked about the importance of good sleep hygiene. I recommended the following at this time: sleep study.  I explained the sleep test procedure to the patient and also outlined possible surgical and non-surgical treatment options of OSA,  including the use of a custom-made dental device (which would require a referral to a specialist dentist or oral surgeon), upper airway surgical options, such as traditional UPPP or a novel less invasive surgical option in the form of Inspire hypoglossal nerve stimulation (which would involve a referral to an ENT surgeon). I also explained the CPAP treatment option to the patient,  who indicated that she would be willing to try CPAP if the need arises. I explained the importance of being compliant with PAP treatment, not only for insurance purposes but primarily to improve Her symptoms, and for the patient's long term health benefit, including to reduce Her cardiovascular risks. I answered all her questions today and the patient was in agreement. I plan to see her back after the sleep study is completed and encouraged her to call with any interim questions, concerns, problems or updates.   Thank you very much for allowing me to participate in the care of this nice patient. If I can be of any further assistance to you please do not hesitate to talk to me.  Sincerely,   Huston Foley, MD, PhD

## 2019-06-24 ENCOUNTER — Telehealth: Payer: Self-pay | Admitting: *Deleted

## 2019-06-24 NOTE — Telephone Encounter (Signed)
Have attempted to reach patient x 3. There is no ring on her side of call.

## 2019-06-25 NOTE — Telephone Encounter (Signed)
Attempted to reach patient again on only # provided. Message: "your call can't be completed at this time, try your call again later."

## 2019-06-26 ENCOUNTER — Encounter: Payer: Self-pay | Admitting: *Deleted

## 2019-06-26 NOTE — Telephone Encounter (Signed)
Unable to LVM x 3,  result letter mailed.

## 2019-07-08 ENCOUNTER — Telehealth: Payer: Self-pay

## 2019-07-08 NOTE — Telephone Encounter (Signed)
We have attempted to call the patient two times to schedule sleep study.  Patient has been unavailable at the phone numbers we have on file and has not returned our calls. If patient calls back we will schedule them for their sleep study.  

## 2019-10-04 ENCOUNTER — Emergency Department (HOSPITAL_BASED_OUTPATIENT_CLINIC_OR_DEPARTMENT_OTHER): Payer: BC Managed Care – PPO

## 2019-10-04 ENCOUNTER — Other Ambulatory Visit: Payer: Self-pay

## 2019-10-04 ENCOUNTER — Emergency Department (HOSPITAL_BASED_OUTPATIENT_CLINIC_OR_DEPARTMENT_OTHER)
Admission: EM | Admit: 2019-10-04 | Discharge: 2019-10-04 | Disposition: A | Discharge status: Home / Self Care | Payer: BC Managed Care – PPO | Attending: Emergency Medicine | Admitting: Emergency Medicine

## 2019-10-04 ENCOUNTER — Encounter (HOSPITAL_BASED_OUTPATIENT_CLINIC_OR_DEPARTMENT_OTHER): Payer: Self-pay | Admitting: Emergency Medicine

## 2019-10-04 DIAGNOSIS — Y999 Unspecified external cause status: Secondary | ICD-10-CM | POA: Insufficient documentation

## 2019-10-04 DIAGNOSIS — R10814 Left lower quadrant abdominal tenderness: Secondary | ICD-10-CM | POA: Insufficient documentation

## 2019-10-04 DIAGNOSIS — R10812 Left upper quadrant abdominal tenderness: Secondary | ICD-10-CM | POA: Diagnosis not present

## 2019-10-04 DIAGNOSIS — Y92410 Unspecified street and highway as the place of occurrence of the external cause: Secondary | ICD-10-CM | POA: Insufficient documentation

## 2019-10-04 DIAGNOSIS — R519 Headache, unspecified: Secondary | ICD-10-CM | POA: Diagnosis present

## 2019-10-04 DIAGNOSIS — M545 Low back pain: Secondary | ICD-10-CM | POA: Diagnosis not present

## 2019-10-04 DIAGNOSIS — S060X0A Concussion without loss of consciousness, initial encounter: Secondary | ICD-10-CM | POA: Insufficient documentation

## 2019-10-04 DIAGNOSIS — M62830 Muscle spasm of back: Secondary | ICD-10-CM | POA: Diagnosis not present

## 2019-10-04 DIAGNOSIS — E119 Type 2 diabetes mellitus without complications: Secondary | ICD-10-CM | POA: Diagnosis not present

## 2019-10-04 DIAGNOSIS — Y9389 Activity, other specified: Secondary | ICD-10-CM | POA: Insufficient documentation

## 2019-10-04 DIAGNOSIS — J45909 Unspecified asthma, uncomplicated: Secondary | ICD-10-CM | POA: Diagnosis not present

## 2019-10-04 LAB — CBC WITH DIFFERENTIAL/PLATELET
Abs Immature Granulocytes: 0.02 10*3/uL (ref 0.00–0.07)
Basophils Absolute: 0 10*3/uL (ref 0.0–0.1)
Basophils Relative: 1 %
Eosinophils Absolute: 0.2 10*3/uL (ref 0.0–0.5)
Eosinophils Relative: 3 %
HCT: 37.7 % (ref 36.0–46.0)
Hemoglobin: 11.7 g/dL — ABNORMAL LOW (ref 12.0–15.0)
Immature Granulocytes: 0 %
Lymphocytes Relative: 28 %
Lymphs Abs: 1.4 10*3/uL (ref 0.7–4.0)
MCH: 27.3 pg (ref 26.0–34.0)
MCHC: 31 g/dL (ref 30.0–36.0)
MCV: 88.1 fL (ref 80.0–100.0)
Monocytes Absolute: 0.4 10*3/uL (ref 0.1–1.0)
Monocytes Relative: 9 %
Neutro Abs: 3 10*3/uL (ref 1.7–7.7)
Neutrophils Relative %: 59 %
Platelets: 264 10*3/uL (ref 150–400)
RBC: 4.28 MIL/uL (ref 3.87–5.11)
RDW: 13.1 % (ref 11.5–15.5)
WBC: 5 10*3/uL (ref 4.0–10.5)
nRBC: 0 % (ref 0.0–0.2)

## 2019-10-04 LAB — PREGNANCY, URINE: Preg Test, Ur: NEGATIVE

## 2019-10-04 LAB — COMPREHENSIVE METABOLIC PANEL
ALT: 12 U/L (ref 0–44)
AST: 14 U/L — ABNORMAL LOW (ref 15–41)
Albumin: 3.5 g/dL (ref 3.5–5.0)
Alkaline Phosphatase: 95 U/L (ref 38–126)
Anion gap: 9 (ref 5–15)
BUN: 10 mg/dL (ref 6–20)
CO2: 25 mmol/L (ref 22–32)
Calcium: 8.4 mg/dL — ABNORMAL LOW (ref 8.9–10.3)
Chloride: 104 mmol/L (ref 98–111)
Creatinine, Ser: 0.54 mg/dL (ref 0.44–1.00)
GFR calc Af Amer: >60 mL/min (ref >60)
GFR calc non Af Amer: >60 mL/min (ref >60)
Glucose, Bld: 101 mg/dL — ABNORMAL HIGH (ref 70–99)
Potassium: 3.8 mmol/L (ref 3.5–5.1)
Sodium: 138 mmol/L (ref 135–145)
Total Bilirubin: 0.5 mg/dL (ref 0.3–1.2)
Total Protein: 7 g/dL (ref 6.5–8.1)

## 2019-10-04 LAB — CBG MONITORING, ED: Glucose-Capillary: 102 mg/dL — ABNORMAL HIGH (ref 70–99)

## 2019-10-04 MED ORDER — IOHEXOL 300 MG/ML  SOLN
100.0000 mL | Freq: Once | INTRAMUSCULAR | Status: AC | PRN
  Administered 2019-10-04: 100 mL via INTRAVENOUS

## 2019-10-04 MED ORDER — DIPHENHYDRAMINE HCL 50 MG/ML IJ SOLN
25.0000 mg | Freq: Once | INTRAMUSCULAR | Status: AC
  Administered 2019-10-04: 25 mg via INTRAVENOUS
  Filled 2019-10-04: qty 1

## 2019-10-04 MED ORDER — ACETAMINOPHEN 325 MG PO TABS
650.0000 mg | ORAL_TABLET | Freq: Once | ORAL | Status: AC
  Administered 2019-10-04: 650 mg via ORAL
  Filled 2019-10-04: qty 2

## 2019-10-04 MED ORDER — PROCHLORPERAZINE EDISYLATE 10 MG/2ML IJ SOLN
10.0000 mg | Freq: Once | INTRAMUSCULAR | Status: AC
  Administered 2019-10-04: 10 mg via INTRAVENOUS
  Filled 2019-10-04: qty 2

## 2019-10-04 MED ORDER — KETOROLAC TROMETHAMINE 15 MG/ML IJ SOLN
15.0000 mg | Freq: Once | INTRAMUSCULAR | Status: AC
  Administered 2019-10-04: 15 mg via INTRAVENOUS
  Filled 2019-10-04: qty 1

## 2019-10-04 MED ORDER — SODIUM CHLORIDE 0.9 % IV BOLUS
500.0000 mL | Freq: Once | INTRAVENOUS | Status: AC
  Administered 2019-10-04: 500 mL via INTRAVENOUS
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
## 2019-10-04 NOTE — ED Notes (Signed)
Pt placed on bedpan, pt very sleepy hubby at bedside

## 2019-10-04 NOTE — Discharge Instructions (Signed)
If you develop continued, recurrent, or worsening headache, fever, neck stiffness, vomiting, blurry or double vision, weakness or numbness in your arms or legs, trouble speaking, or any other new/concerning symptoms then return to the ER for evaluation.  

## 2019-10-04 NOTE — ED Notes (Signed)
CT awaiting results from [facility] and pregnancy test prior to scanning, per radiology protocol;

## 2020-07-02 ENCOUNTER — Encounter (HOSPITAL_BASED_OUTPATIENT_CLINIC_OR_DEPARTMENT_OTHER): Payer: Self-pay

## 2020-07-02 ENCOUNTER — Other Ambulatory Visit: Payer: Self-pay

## 2020-07-02 ENCOUNTER — Emergency Department (HOSPITAL_BASED_OUTPATIENT_CLINIC_OR_DEPARTMENT_OTHER)
Admission: EM | Admit: 2020-07-02 | Discharge: 2020-07-02 | Disposition: A | Discharge status: Home / Self Care | Payer: BC Managed Care – PPO | Attending: Emergency Medicine | Admitting: Emergency Medicine

## 2020-07-02 ENCOUNTER — Emergency Department (HOSPITAL_BASED_OUTPATIENT_CLINIC_OR_DEPARTMENT_OTHER): Payer: BC Managed Care – PPO

## 2020-07-02 DIAGNOSIS — Z7951 Long term (current) use of inhaled steroids: Secondary | ICD-10-CM | POA: Diagnosis not present

## 2020-07-02 DIAGNOSIS — J45909 Unspecified asthma, uncomplicated: Secondary | ICD-10-CM | POA: Diagnosis not present

## 2020-07-02 DIAGNOSIS — J019 Acute sinusitis, unspecified: Secondary | ICD-10-CM | POA: Insufficient documentation

## 2020-07-02 DIAGNOSIS — R059 Cough, unspecified: Secondary | ICD-10-CM | POA: Diagnosis present

## 2020-07-02 MED ORDER — GUAIFENESIN ER 600 MG PO TB12
600.0000 mg | ORAL_TABLET | Freq: Two times a day (BID) | ORAL | 0 refills | Status: DC
Start: 2020-07-02 — End: 2020-07-02

## 2020-07-02 MED ORDER — GUAIFENESIN ER 600 MG PO TB12: 600.0000 mg | ORAL_TABLET | Freq: Two times a day (BID) | ORAL | 0 refills | Status: DC

## 2020-07-02 MED ORDER — AEROCHAMBER PLUS FLO-VU LARGE MISC
1.0000 | Freq: Once | Status: DC
  Filled 2020-07-02: qty 1

## 2020-07-02 MED ORDER — FLUTICASONE PROPIONATE 50 MCG/ACT NA SUSP
2.0000 | Freq: Every day | NASAL | 0 refills | Status: AC
Start: 2020-07-02 — End: ?

## 2020-07-02 MED ORDER — DOXYCYCLINE HYCLATE 100 MG PO CAPS
100.0000 mg | ORAL_CAPSULE | Freq: Two times a day (BID) | ORAL | 0 refills | Status: AC
Start: 2020-07-02 — End: 2020-07-09

## 2020-07-02 MED ORDER — ALBUTEROL SULFATE HFA 108 (90 BASE) MCG/ACT IN AERS
2.0000 | INHALATION_SPRAY | Freq: Once | RESPIRATORY_TRACT | Status: AC
  Administered 2020-07-02: 2 via RESPIRATORY_TRACT
  Filled 2020-07-02: qty 6.7

## 2020-07-02 NOTE — ED Provider Notes (Signed)
[facility] EMERGENCY DEPARTMENT Provider Note   CSN: [id number] Arrival date & time: 07/02/20  1916     History Chief Complaint  Patient presents with  . Cough    Courtney Medina is a 45 y.o. female.  HPI   45 year old female with a history of allergies, asthma, gestational diabetes, who presents to the emergency department today for evaluation of URI symptoms.  States she has had a cough for about 2 weeks.  She was seen at urgent care and tested for Covid strep and flu which were all negative.  She was given cough medication which seemed to improve symptoms and she took some time off work however when she had returned to work her symptoms recurred and now she is coughing, lost her voice and has worse nasal congestion, sinus pressure and a headache.  She is also having sweats and chills.  She has been taking Sudafed without relief.  Past Medical History:  Diagnosis Date  . Allergies   . Asthma   . Gestational diabetes   . Gestational diabetes 2017    There are no problems to display for this patient.   Past Surgical History:  Procedure Laterality Date  . CESAREAN SECTION     x 3  . CHOLECYSTECTOMY  2016  . TUBAL LIGATION       OB History    Gravida  1   Para      Term      Preterm      AB      Living        SAB      IAB      Ectopic      Multiple      Live Births              Family History  Problem Relation Age of Onset  . Breast cancer Mother   . Asthma Mother   . Eczema Mother   . Allergies Mother   . ALS Father   . Diabetes Other   . Heart disease Other   . Hyperlipidemia Other   . Hypertension Other   . Allergic rhinitis Other   . Asthma Other   . Eczema Other   . Urticaria Other   . Allergies Other   . Breast cancer Maternal Aunt   . Allergic rhinitis Child   . Asthma Child   . Eczema Child   . Allergies Child   . Cancer Maternal Grandfather   . Emphysema Paternal Grandmother   . Diabetes Paternal Grandfather    . Heart disease Paternal Grandfather     Social History   Tobacco Use  . Smoking status: Never Smoker  . Smokeless tobacco: Never Used  Substance Use Topics  . Alcohol use: Yes    Comment: occ  . Drug use: No    Home Medications Prior to Admission medications   Medication Sig Start Date End Date Taking? Authorizing Provider  doxycycline (VIBRAMYCIN) 100 MG capsule Take 1 capsule (100 mg total) by mouth 2 (two) times daily for 7 days. 07/02/20 07/09/20 Yes Couture, Cortni S, PA-C  fluticasone (FLONASE) 50 MCG/ACT nasal spray Place 2 sprays into both nostrils daily. 07/02/20  Yes Couture, Cortni S, PA-C  amphetamine-dextroamphetamine (ADDERALL) 30 MG tablet Take 30 mg by mouth daily.    [provider]  cetirizine (ZYRTEC) 10 MG tablet Take 10 mg by mouth daily.    [provider]  diphenhydrAMINE (BENADRYL) 12.5 MG chewable tablet Chew 12.5  mg by mouth 4 (four) times daily as needed for allergies. 1-2 tabs as needed    [provider]  guaiFENesin (MUCINEX) 600 MG 12 hr tablet Take 1 tablet (600 mg total) by mouth 2 (two) times daily. 07/02/20   Couture, Cortni S, PA-C    Allergies    Erythromycin and Penicillins  Review of Systems   Review of Systems  Constitutional: Positive for chills and diaphoresis.  HENT: Positive for congestion, rhinorrhea, sinus pressure, sinus pain and voice change. Negative for ear pain and sore throat.   Eyes: Negative for pain and visual disturbance.  Respiratory: Positive for cough. Negative for shortness of breath.   Cardiovascular: Negative for chest pain and palpitations.  Gastrointestinal: Negative for abdominal pain, constipation, diarrhea, nausea and vomiting.  Genitourinary: Negative for dysuria and hematuria.  Musculoskeletal: Negative for back pain.  Skin: Negative for rash.  Neurological: Positive for headaches. Negative for seizures and syncope.  All other systems reviewed and are negative.   Physical  Exam Updated Vital Signs BP (!) 157/80 (BP Location: Left Arm)   Pulse 99   Temp 97.6 F (36.4 C) (Oral)   Resp 20   Ht 4\' 10"  (1.473 m)   Wt 78 kg   LMP 07/02/2020   SpO2 100%   BMI 35.95 kg/m   Physical Exam Vitals and nursing note reviewed.  Constitutional:      General: She is not in acute distress.    Appearance: She is well-developed.  HENT:     Head: Normocephalic and atraumatic.     Nose:     Comments: Nasal turbinates are swollen bilaterally    Mouth/Throat:     Pharynx: No oropharyngeal exudate or posterior oropharyngeal erythema.     Tonsils: No tonsillar exudate or tonsillar abscesses. 0 on the right. 0 on the left.  Eyes:     Conjunctiva/sclera: Conjunctivae normal.  Cardiovascular:     Rate and Rhythm: Normal rate and regular rhythm.     Heart sounds: Normal heart sounds. No murmur heard.   Pulmonary:     Effort: Pulmonary effort is normal. No respiratory distress.     Breath sounds: Normal breath sounds. No wheezing, rhonchi or rales.  Abdominal:     General: Bowel sounds are normal.     Palpations: Abdomen is soft.     Tenderness: There is no abdominal tenderness. There is no guarding or rebound.  Musculoskeletal:     Cervical back: Neck supple.  Skin:    General: Skin is warm and dry.  Neurological:     Mental Status: She is alert.     ED Results / Procedures / Treatments   Labs (all labs ordered are listed, but only abnormal results are displayed) Labs Reviewed - No data to display  EKG None  Radiology DG Chest Portable 1 View  Result Date: 07/02/2020 CLINICAL DATA:  Cough, flu like symptoms for 2 weeks EXAM: PORTABLE CHEST 1 VIEW COMPARISON:  Radiograph 11/03/2019 FINDINGS: No consolidation, features of edema, pneumothorax, or effusion. Pulmonary vascularity is normally distributed. The cardiomediastinal contours are unremarkable. No acute osseous or soft tissue abnormality. IMPRESSION: No acute cardiopulmonary abnormality. Electronically  Signed   By: 01/04/2020 M.D.   On: 07/02/2020 21:01    Procedures Procedures   Medications Ordered in ED Medications  AeroChamber Plus Flo-Vu Large MISC 1 each (has no administration in time range)  albuterol (VENTOLIN HFA) 108 (90 Base) MCG/ACT inhaler 2 puff (2 puffs Inhalation Given 07/02/20 2031)  ED Course  I have reviewed the triage vital signs and the nursing notes.  Pertinent labs & imaging results that were available during my care of the patient were reviewed by me and considered in my medical decision making (see chart for details).    MDM Rules/Calculators/A&P                          45 year old female presenting the emergency department today for evaluation of URI symptoms.  Ongoing for 2 weeks.  Had improvement of symptoms initially but they worsened a few days ago.  Now complaining of chills, increased nasal congestion with purulent drainage and per persistent cough.  Chest x-ray reviewed/interpreted - * No acute cardiopulmonary abnormality  Patient has already had Covid strep and flu test.  Do not feel that repeat testing is indicated at this time.  We will treat for sinus infection with fluticasone, Mucinex and Augmentin. Will also give albuterol. She was given a dose here in the ED and sxs improved.  We will have her follow-up with her PCP and return to the ED for new or worsening symptoms.  She voiced understanding plan and reasons to return. All Questions answered.  Patient stable for discharge.   Final Clinical Impression(s) / ED Diagnoses Final diagnoses:  Acute sinusitis, recurrence not specified, unspecified location    Rx / DC Orders ED Discharge Orders         Ordered    guaiFENesin (MUCINEX) 600 MG 12 hr tablet  2 times daily,   Status:  Discontinued        07/02/20 2153    fluticasone (FLONASE) 50 MCG/ACT nasal spray  Daily        07/02/20 2153    doxycycline (VIBRAMYCIN) 100 MG capsule  2 times daily        07/02/20 2153    guaiFENesin  (MUCINEX) 600 MG 12 hr tablet  2 times daily        07/02/20 2155           Karrie Meres, PA-C 07/02/20 2330    Charlynne Pander, MD 07/02/20 2351

## 2020-07-02 NOTE — Discharge Instructions (Addendum)
Use 2 puffs of the albuterol inhaler every 4-6 hours as needed for cough or shortness of breath.  Use the Mucinex, Flonase and doxycycline as directed.  Do not take doxycycline if pregnant or breastfeeding  Please follow up with your primary care provider within 5-7 days for re-evaluation of your symptoms. If you do not have a primary care provider, information for a healthcare clinic has been provided for you to make arrangements for follow up care. Please return to the emergency department for any new or worsening symptoms.

## 2020-07-02 NOTE — ED Triage Notes (Signed)
Pt c/o flu like sx x 2 weeks-states she was last week with neg covid/neg strep-NAD-steady gait

## 2021-02-03 ENCOUNTER — Emergency Department (HOSPITAL_BASED_OUTPATIENT_CLINIC_OR_DEPARTMENT_OTHER)
Admission: EM | Admit: 2021-02-03 | Discharge: 2021-02-04 | Disposition: A | Discharge status: Home / Self Care | Payer: BC Managed Care – PPO | Attending: Emergency Medicine | Admitting: Emergency Medicine

## 2021-02-03 ENCOUNTER — Encounter (HOSPITAL_BASED_OUTPATIENT_CLINIC_OR_DEPARTMENT_OTHER): Payer: Self-pay | Admitting: Emergency Medicine

## 2021-02-03 ENCOUNTER — Other Ambulatory Visit: Payer: Self-pay

## 2021-02-03 DIAGNOSIS — R1084 Generalized abdominal pain: Secondary | ICD-10-CM | POA: Diagnosis not present

## 2021-02-03 DIAGNOSIS — J45909 Unspecified asthma, uncomplicated: Secondary | ICD-10-CM | POA: Diagnosis not present

## 2021-02-03 DIAGNOSIS — R110 Nausea: Secondary | ICD-10-CM | POA: Insufficient documentation

## 2021-02-03 DIAGNOSIS — R059 Cough, unspecified: Secondary | ICD-10-CM | POA: Diagnosis not present

## 2021-02-03 DIAGNOSIS — R1013 Epigastric pain: Secondary | ICD-10-CM

## 2021-02-03 DIAGNOSIS — R109 Unspecified abdominal pain: Secondary | ICD-10-CM | POA: Diagnosis present

## 2021-02-03 DIAGNOSIS — Z20822 Contact with and (suspected) exposure to covid-19: Secondary | ICD-10-CM | POA: Insufficient documentation

## 2021-02-03 NOTE — ED Triage Notes (Signed)
Pt reports sharp abd pain with nausea and loose stools since this morning. Pt also states her head feels abnormal and feels like she can't get her words out.

## 2021-02-03 NOTE — ED Provider Notes (Signed)
[facility] EMERGENCY DEPARTMENT Provider Note   CSN: [id number] Arrival date & time: 02/03/21  2303     History Chief Complaint  Patient presents with   Abdominal Pain    Courtney Medina is a 45 y.o. female.  The history is provided by the patient and medical records.  Abdominal Pain Courtney Medina is a 45 y.o. female who presents to the Emergency Department complaining of abdominal pain.  She presents to the ED accompanied by her husband for evaluation of sharp, epigastric abdominal pain that started this morning.  She also feels strange mentally with difficulty processing her thoughts.  Feels SOB at times.  Has mild cough.  Stared metformin 2-3 weeks ago, 500 mg once daily. She was started on this medication for prediabetes and weight control. She is status post tubal ligation and cholecystectomy. LMP was on Sunday. Drinks occasional alcohol, last drink was several days ago. 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  No fever, vomiting.  Unable to eat today.  Has nausea.  Had loose stools for the last few days.  No dysuria.       Past Medical History:  Diagnosis Date   Allergies    Asthma    Gestational diabetes    Gestational diabetes 2017    There are no problems to display for this patient.   Past Surgical History:  Procedure Laterality Date   CESAREAN SECTION     x 3   CHOLECYSTECTOMY  2016   TUBAL LIGATION       OB History     Gravida  1   Para      Term      Preterm      AB      Living         SAB      IAB      Ectopic      Multiple      Live Births              Family History  Problem Relation Age of Onset   Breast cancer Mother    Asthma Mother    Eczema Mother    Allergies Mother    ALS Father    Diabetes Other    Heart disease Other    Hyperlipidemia Other    Hypertension Other    Allergic rhinitis Other    Asthma Other    Eczema Other    Urticaria Other    Allergies Other    Breast  cancer Maternal Aunt    Allergic rhinitis Child    Asthma Child    Eczema Child    Allergies Child    Cancer Maternal Grandfather    Emphysema Paternal Grandmother    Diabetes Paternal Grandfather    Heart disease Paternal Grandfather     Social History   Tobacco Use   Smoking status: Never   Smokeless tobacco: Never  Substance Use Topics   Alcohol use: Yes    Comment: occ   Drug use: No    Home Medications Prior to Admission medications   Medication Sig Start Date End Date Taking? Authorizing Provider  dicyclomine (BENTYL) 20 MG tablet Take 1 tablet (20 mg total) by mouth 2 (two) times daily as needed for spasms. 02/04/21  Yes Tilden Fossa, MD  ondansetron (ZOFRAN ODT) 4 MG disintegrating tablet Take 1 tablet (4 mg total) by mouth every 8 (eight) hours as needed for nausea or vomiting. 02/04/21  Yes Tilden Fossa, MD  amphetamine-dextroamphetamine (ADDERALL) 30 MG tablet Take 30 mg by mouth daily.    [provider]  cetirizine (ZYRTEC) 10 MG tablet Take 10 mg by mouth daily.    [provider]  diphenhydrAMINE (BENADRYL) 12.5 MG chewable tablet Chew 12.5 mg by mouth 4 (four) times daily as needed for allergies. 1-2 tabs as needed    [provider]  fluticasone (FLONASE) 50 MCG/ACT nasal spray Place 2 sprays into both nostrils daily. 07/02/20   Couture, Cortni S, PA-C  guaiFENesin (MUCINEX) 600 MG 12 hr tablet Take 1 tablet (600 mg total) by mouth 2 (two) times daily. 07/02/20   Couture, Cortni S, PA-C    Allergies    Erythromycin and Penicillins  Review of Systems   Review of Systems  Gastrointestinal:  Positive for abdominal pain.  All other systems reviewed and are negative.  Physical Exam Updated Vital Signs BP 103/73 (BP Location: Left Arm)   Pulse 61   Temp 98.1 F (36.7 C) (Oral)   Resp 18   Ht 4' 9.25" (1.454 m)   Wt 85.7 kg   LMP 01/27/2021 (Exact Date)   SpO2 100%   BMI 40.54 kg/m   Physical Exam Vitals and nursing  note reviewed.  Constitutional:      Appearance: She is well-developed.  HENT:     Head: Normocephalic and atraumatic.  Cardiovascular:     Rate and Rhythm: Normal rate and regular rhythm.  Pulmonary:     Effort: Pulmonary effort is normal. No respiratory distress.  Abdominal:     Palpations: Abdomen is soft.     Tenderness: There is no guarding or rebound.     Comments: Mild generalized abdominal tenderness  Musculoskeletal:        General: No tenderness.  Skin:    General: Skin is warm and dry.  Neurological:     Mental Status: She is alert and oriented to person, place, and time.     Comments: No asymmetry of facial movements. Visual fields grossly intact. Five out of five strength in all four extremities with sensation to light touch intact in all four extremities  Psychiatric:        Behavior: Behavior normal.    ED Results / Procedures / Treatments   Labs (all labs ordered are listed, but only abnormal results are displayed) Labs Reviewed  COMPREHENSIVE METABOLIC PANEL - Abnormal; Notable for the following components:      Result Value   Sodium 134 (*)    Glucose, Bld 107 (*)    Calcium 8.6 (*)    All other components within normal limits  CBC WITH DIFFERENTIAL/PLATELET - Abnormal; Notable for the following components:   Hemoglobin 11.2 (*)    HCT 35.4 (*)    All other components within normal limits  URINALYSIS, ROUTINE W REFLEX MICROSCOPIC - Abnormal; Notable for the following components:   Color, Urine STRAW (*)    All other components within normal limits  RESP PANEL BY RT-PCR (FLU A&B, COVID) ARPGX2  LIPASE, BLOOD  PREGNANCY, URINE  TROPONIN I (HIGH SENSITIVITY)    EKG EKG Interpretation  Date/Time:  Thursday February 04 2021 01:14:02 EDT Ventricular Rate:  61 PR Interval:  151 QRS Duration: 134 QT Interval:  431 QTC Calculation: 435 R Axis:   8 Text Interpretation: Sinus rhythm Nonspecific intraventricular conduction delay Nonspecific T  abnormalities, anterior leads Confirmed by Tilden Fossa [phone]) on 02/04/2021 1:23:44 AM  Radiology DG Chest 2 View  Result  Date: 02/04/2021 CLINICAL DATA:  Shortness of breath. Abdominal pain. Nausea, and loose stools. EXAM: CHEST - 2 VIEW COMPARISON:  07/02/2020 FINDINGS: The heart size and mediastinal contours are within normal limits. Both lungs are clear. The visualized skeletal structures are unremarkable. IMPRESSION: No active cardiopulmonary disease. Electronically Signed   By: Burman Nieves M.D.   On: 02/04/2021 01:30    Procedures Procedures   Medications Ordered in ED Medications  ondansetron (ZOFRAN) injection 4 mg (4 mg Intravenous Given 02/04/21 0021)  dicyclomine (BENTYL) capsule 10 mg (10 mg Oral Given 02/04/21 0010)  sodium chloride 0.9 % bolus 500 mL ( Intravenous Stopped 02/04/21 0200)    ED Course  I have reviewed the triage vital signs and the nursing notes.  Pertinent labs & imaging results that were available during my care of the patient were reviewed by me and considered in my medical decision making (see chart for details).    MDM Rules/Calculators/A&P                          patient here for evaluation of abdominal pain and feeling of and wellness that started today. Overall her abdominal pain has improved but she still feels unwell with difficult to describe symptoms. She is mild tenderness on examination without peritoneal findings. She has a normal neurologic examination. EKG is abnormal but similar when compared to priors. Current presentation is not consistent with CVA, ACS, dissection, PE, pancreatitis, appendicitis. Discussed with patient unclear source of symptoms, question viral illness. Discussed importance of outpatient follow-up and return precautions.  Final Clinical Impression(s) / ED Diagnoses Final diagnoses:  Epigastric pain    Rx / DC Orders ED Discharge Orders          Ordered    ondansetron (ZOFRAN ODT) 4 MG disintegrating  tablet  Every 8 hours PRN        02/04/21 0209    dicyclomine (BENTYL) 20 MG tablet  2 times daily PRN        02/04/21 0209             Tilden Fossa, MD 02/04/21 [phone]

## 2021-02-04 ENCOUNTER — Emergency Department (HOSPITAL_BASED_OUTPATIENT_CLINIC_OR_DEPARTMENT_OTHER): Payer: BC Managed Care – PPO

## 2021-02-04 DIAGNOSIS — R1084 Generalized abdominal pain: Secondary | ICD-10-CM | POA: Diagnosis not present

## 2021-02-04 LAB — URINALYSIS, ROUTINE W REFLEX MICROSCOPIC
Bilirubin Urine: NEGATIVE
Glucose, UA: NEGATIVE mg/dL
Hgb urine dipstick: NEGATIVE
Ketones, ur: NEGATIVE mg/dL
Leukocytes,Ua: NEGATIVE
Nitrite: NEGATIVE
Protein, ur: NEGATIVE mg/dL
Specific Gravity, Urine: 1.005 (ref 1.005–1.030)
pH: 6 (ref 5.0–8.0)

## 2021-02-04 LAB — COMPREHENSIVE METABOLIC PANEL
ALT: 14 U/L (ref 0–44)
AST: 17 U/L (ref 15–41)
Albumin: 3.7 g/dL (ref 3.5–5.0)
Alkaline Phosphatase: 105 U/L (ref 38–126)
Anion gap: 7 (ref 5–15)
BUN: 11 mg/dL (ref 6–20)
CO2: 24 mmol/L (ref 22–32)
Calcium: 8.6 mg/dL — ABNORMAL LOW (ref 8.9–10.3)
Chloride: 103 mmol/L (ref 98–111)
Creatinine, Ser: 0.57 mg/dL (ref 0.44–1.00)
GFR, Estimated: >60 mL/min (ref >60)
Glucose, Bld: 107 mg/dL — ABNORMAL HIGH (ref 70–99)
Potassium: 3.6 mmol/L (ref 3.5–5.1)
Sodium: 134 mmol/L — ABNORMAL LOW (ref 135–145)
Total Bilirubin: 0.5 mg/dL (ref 0.3–1.2)
Total Protein: 7.1 g/dL (ref 6.5–8.1)

## 2021-02-04 LAB — CBC WITH DIFFERENTIAL/PLATELET
Abs Immature Granulocytes: 0.02 10*3/uL (ref 0.00–0.07)
Basophils Absolute: 0 10*3/uL (ref 0.0–0.1)
Basophils Relative: 1 %
Eosinophils Absolute: 0.2 10*3/uL (ref 0.0–0.5)
Eosinophils Relative: 2 %
HCT: 35.4 % — ABNORMAL LOW (ref 36.0–46.0)
Hemoglobin: 11.2 g/dL — ABNORMAL LOW (ref 12.0–15.0)
Immature Granulocytes: 0 %
Lymphocytes Relative: 32 %
Lymphs Abs: 2.4 10*3/uL (ref 0.7–4.0)
MCH: 27.3 pg (ref 26.0–34.0)
MCHC: 31.6 g/dL (ref 30.0–36.0)
MCV: 86.3 fL (ref 80.0–100.0)
Monocytes Absolute: 0.6 10*3/uL (ref 0.1–1.0)
Monocytes Relative: 8 %
Neutro Abs: 4.3 10*3/uL (ref 1.7–7.7)
Neutrophils Relative %: 57 %
Platelets: 272 10*3/uL (ref 150–400)
RBC: 4.1 MIL/uL (ref 3.87–5.11)
RDW: 13.2 % (ref 11.5–15.5)
WBC: 7.5 10*3/uL (ref 4.0–10.5)
nRBC: 0 % (ref 0.0–0.2)

## 2021-02-04 LAB — PREGNANCY, URINE: Preg Test, Ur: NEGATIVE

## 2021-02-04 LAB — LIPASE, BLOOD: Lipase: 23 U/L (ref 11–51)

## 2021-02-04 LAB — RESP PANEL BY RT-PCR (FLU A&B, COVID) ARPGX2
Influenza A by PCR: NEGATIVE
Influenza B by PCR: NEGATIVE
SARS Coronavirus 2 by RT PCR: NEGATIVE

## 2021-02-04 LAB — TROPONIN I (HIGH SENSITIVITY): Troponin I (High Sensitivity): <2 ng/L (ref <18)

## 2021-02-04 MED ORDER — DICYCLOMINE HCL 10 MG PO CAPS
10.0000 mg | ORAL_CAPSULE | Freq: Once | ORAL | Status: AC
  Administered 2021-02-04: 10 mg via ORAL
  Filled 2021-02-04: qty 1

## 2021-02-04 MED ORDER — DICYCLOMINE HCL 20 MG PO TABS: 20.0000 mg | ORAL_TABLET | Freq: Two times a day (BID) | ORAL | 0 refills | Status: DC | PRN

## 2021-02-04 MED ORDER — ONDANSETRON HCL 4 MG/2ML IJ SOLN
4.0000 mg | Freq: Once | INTRAMUSCULAR | Status: AC
  Administered 2021-02-04: 4 mg via INTRAVENOUS
  Filled 2021-02-04: qty 2

## 2021-02-04 MED ORDER — ONDANSETRON 4 MG PO TBDP: 4.0000 mg | ORAL_TABLET | Freq: Three times a day (TID) | ORAL | 0 refills | Status: DC | PRN

## 2021-02-04 MED ORDER — SODIUM CHLORIDE 0.9 % IV BOLUS
500.0000 mL | Freq: Once | INTRAVENOUS | Status: AC
  Administered 2021-02-04: 500 mL via INTRAVENOUS

## 2021-02-04 NOTE — ED Notes (Signed)
Patient transported to X-ray 

## 2021-08-13 ENCOUNTER — Ambulatory Visit (HOSPITAL_COMMUNITY)
Admission: EM | Admit: 2021-08-13 | Discharge: 2021-08-13 | Disposition: A | Discharge status: Home / Self Care | Payer: Medicaid Other | Attending: Psychiatry | Admitting: Psychiatry

## 2021-08-13 DIAGNOSIS — Z7984 Long term (current) use of oral hypoglycemic drugs: Secondary | ICD-10-CM | POA: Insufficient documentation

## 2021-08-13 DIAGNOSIS — F410 Panic disorder [episodic paroxysmal anxiety] without agoraphobia: Secondary | ICD-10-CM

## 2021-08-13 DIAGNOSIS — R7303 Prediabetes: Secondary | ICD-10-CM | POA: Insufficient documentation

## 2021-08-13 DIAGNOSIS — F331 Major depressive disorder, recurrent, moderate: Secondary | ICD-10-CM

## 2021-08-13 DIAGNOSIS — F411 Generalized anxiety disorder: Secondary | ICD-10-CM

## 2021-08-13 DIAGNOSIS — F909 Attention-deficit hyperactivity disorder, unspecified type: Secondary | ICD-10-CM | POA: Insufficient documentation

## 2021-08-13 DIAGNOSIS — Z880 Allergy status to penicillin: Secondary | ICD-10-CM | POA: Insufficient documentation

## 2021-08-13 DIAGNOSIS — Z881 Allergy status to other antibiotic agents status: Secondary | ICD-10-CM | POA: Insufficient documentation

## 2021-08-13 DIAGNOSIS — Z818 Family history of other mental and behavioral disorders: Secondary | ICD-10-CM | POA: Insufficient documentation

## 2021-08-13 MED ORDER — HYDROXYZINE HCL 50 MG PO TABS: 50.0000 mg | ORAL_TABLET | Freq: Three times a day (TID) | ORAL | 0 refills | Status: AC | PRN

## 2021-08-13 MED ORDER — HYDROXYZINE HCL 25 MG PO TABS
50.0000 mg | ORAL_TABLET | Freq: Three times a day (TID) | ORAL | Status: DC | PRN
  Filled 2021-08-13: qty 20
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
## 2021-08-13 NOTE — Discharge Instructions (Signed)
?  Please come to [facility] (this facility) during walk in hours for appointment with psychiatrist/provider for further medication management and for therapists for therapy.  ? ? Walk-Ins for medication management  are available on Monday, Wednesday, Thursday and Friday from 8am-11am.  It is first come, first -serve; it is best to arrive by 7:00 AM.  ? ? Walk-Ins for therapy are  available on Monday and Wednesday?s  8am-11am.  It is first come, first -serve; it is best to arrive by 7:00 AM.  ? ? ?When you arrive please go upstairs for your appointment. If you are unsure of where to go, inform the front desk that you are here for a walk in appointment and they will assist you with directions upstairs. ? ?Address:  ?[address], in [place], [address] ?Ph: [phone]  ? ? ? ? ? ?

## 2021-10-12 ENCOUNTER — Ambulatory Visit (INDEPENDENT_AMBULATORY_CARE_PROVIDER_SITE_OTHER): Payer: BC Managed Care – PPO | Admitting: Neurology

## 2021-10-12 ENCOUNTER — Encounter: Payer: Self-pay | Admitting: Neurology

## 2021-10-12 VITALS — BP 121/79 | HR 79 | Ht <= 58 in | Wt 192.0 lb

## 2021-10-12 DIAGNOSIS — R519 Headache, unspecified: Secondary | ICD-10-CM | POA: Diagnosis not present

## 2021-10-12 DIAGNOSIS — R0683 Snoring: Secondary | ICD-10-CM | POA: Diagnosis not present

## 2021-10-12 DIAGNOSIS — G4719 Other hypersomnia: Secondary | ICD-10-CM

## 2021-10-12 DIAGNOSIS — R635 Abnormal weight gain: Secondary | ICD-10-CM

## 2021-10-12 DIAGNOSIS — Z820 Family history of epilepsy and other diseases of the nervous system: Secondary | ICD-10-CM

## 2021-10-12 NOTE — Patient Instructions (Signed)
It was nice to see you again today. As discussed, we will proceed with sleep testing.  We will ask your insurance for authorization for testing. As explained, an attended sleep study (meaning you get to stay overnight in the sleep lab), lets [place] monitor sleep-related behaviors such as sleep talking and leg movements in sleep, in addition to monitoring for sleep apnea.  A home sleep test is a screening tool for sleep apnea diagnosis only, but unfortunately, does not help with any other sleep-related diagnoses.  Please remember, the long-term risks and ramifications of untreated moderate to severe obstructive sleep apnea may include (but are not limited to): increased risk for cardiovascular disease, including congestive heart failure, stroke, difficult to control hypertension, treatment resistant obesity, arrhythmias, especially irregular heartbeat commonly known as A. Fib. (atrial fibrillation); even type 2 diabetes has been linked to untreated OSA.   Other correlations that untreated obstructive sleep apnea include macular edema which is swelling of the retina in the eyes, droopy eyelid syndrome, and elevated hemoglobin and hematocrit levels (often referred to as polycythemia).  Sleep apnea can cause disruption of sleep and sleep deprivation in most cases, which, in turn, can cause recurrent headaches, problems with memory, mood, concentration, focus, and vigilance. Most people with untreated sleep apnea report excessive daytime sleepiness, which can affect their ability to drive. Please do not drive or use heavy equipment or machinery, if you feel sleepy! Patients with sleep apnea can also develop difficulty initiating and maintaining sleep (aka insomnia).   Having sleep apnea may increase your risk for other sleep disorders, including involuntary behaviors sleep such as sleep terrors, sleep talking, sleepwalking.    Having sleep apnea can also increase your risk for restless leg syndrome and leg  movements at night.   Please note that untreated obstructive sleep apnea may carry additional perioperative morbidity. Patients with significant obstructive sleep apnea (typically, in the moderate to severe degree) should receive, if possible, perioperative PAP (positive airway pressure) therapy and the surgeons and particularly the anesthesiologists should be informed of the diagnosis and the severity of the sleep disordered breathing.   We will call you or email you through MyChart with regards to your test results and plan a follow-up in sleep clinic accordingly. Most likely, you will hear from one of our nurses.   Our sleep lab administrative assistant will call you to schedule your sleep study and give you further instructions, regarding the check in process for the sleep study, arrival time, what to bring, when you can expect to leave after the study, etc., and to answer any other logistical questions you may have. If you don't hear back from her by about 2 weeks from now, please feel free to call her direct line at [phone] or you can call our general clinic number, or email [place] through My Chart.

## 2021-10-12 NOTE — Progress Notes (Signed)
Subjective:    Patient ID: Courtney Medina is a 46 y.o. female.  HPI       Interim history:   Courtney Medina (prev. Courtney Medina) is a 46 year old right-handed woman with an underlying medical history of allergies, asthma, and obesity, who presents for evaluation of her sleep disturbance.  I had evaluated her over 2 years ago at the request of Dr. Marjory Lies for her daytime somnolence and concern for sleep apnea.  She had a prior diagnosis of OSA and was advised to proceed with sleep testing.  She did not pursue sleep testing at the time.  Today, 10/12/2021: She reports that her snoring has become worse.  She has had weight changes and was able to lose weight, down into the 150s even but then gained weight back.  She does wake up with a headache fairly frequently.  She also reports a history of migraines.  Her father had sleep apnea, he also had ALS.  She has not made a follow-up appointment with Dr. Marjory Lies, she has had intermittent weakness in her shoulder girdle.  It does not happen all the time and is typically not related to sleeping in a certain position.  She goes to bed around 8 or 9 PM and rise time is around 4.  She works as a Runner, broadcasting/film/video, lives at home with her husband and 3 children.  She snores loudly per family.  Years ago she had sleep testing which showed mild sleep apnea but she was never on CPAP therapy.  She would be willing to consider PAP therapy at this time.  She would like to work harder on weight loss.  She has not made a follow-up appointment with her primary care to discuss weight loss.  She had a prescription for hydroxyzine for anxiety but ran out.  She drinks caffeine in the form of green tea, about 1 cup/day.  She thinks no daily coffee or soda.  No daily alcohol, she is a non-smoker.  She does not have night to night nocturia.  Epworth sleepiness score is 11 out of 24, fatigue severity score is 50 out of 63.  The patient's allergies, current medications, family history, past  medical history, past social history, past surgical history and problem list were reviewed and updated as appropriate.     Previously:   06/20/19: (She) reports daytime somnolence. I reviewed the office note from 06/04/2019.  Her Epworth sleepiness score is 9 out of 24, fatigue severity score is 31 out of 63.  She reports that she had sleep study testing on 2 occasions in the past but was never advised to start CPAP therapy.  One study was in [place] and the [place] in [place].  Her last study in 2014 and neurology did show some sleep apnea but she believes it was mild and she had the option of trying CPAP.  She has gained weight in the realm of 30 pounds in the past year since the pandemic she admits.  She has been working on weight loss.  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  She has 29-year-old who cosleeps with her, her husband often sleeps in a different bedroom.  She has an 69-year-old and a 47 year old as well.  She has a 51 year old stepson that stays with him partially.  She is not aware of any family history of OSA.  Her father has ALS, her mother does snore.  She has woken up with a headache at times, she does not have night to night nocturia, she  has to be up at 430, she has a 35-minute commute one way, bedtime is around 830.  Her husband has noted her snoring.  She has had some shortness of breath at times.  She drinks caffeine in the form of coffee, 1 cup/day on average, occasional tea, no soda, she does not smoke, she drinks alcohol occasionally.   Her Past Medical History Is Significant For: Past Medical History:  Diagnosis Date   Allergies    Asthma    Gestational diabetes    Gestational diabetes 2017    Her Past Surgical History Is Significant For: Past Surgical History:  Procedure Laterality Date   CESAREAN SECTION     x 3   CHOLECYSTECTOMY  2016   TUBAL LIGATION      Her Family History Is Significant For: Family History  Problem Relation Age of Onset   Breast  cancer Mother    Asthma Mother    Eczema Mother    Allergies Mother    Sleep apnea Mother    ALS Father    Sleep apnea Father    Breast cancer Maternal Aunt    Cancer Maternal Grandfather    Emphysema Paternal Grandmother    Diabetes Paternal Grandfather    Heart disease Paternal Grandfather    Allergic rhinitis Child    Asthma Child    Eczema Child    Allergies Child    Diabetes Other    Heart disease Other    Hyperlipidemia Other    Hypertension Other    Allergic rhinitis Other    Asthma Other    Eczema Other    Urticaria Other    Allergies Other     Her Social History Is Significant For: Social History   Socioeconomic History   Marital status: Married    Spouse name: Merchant navy officer   Number of children: 3   Years of education: Not on file   Highest education level: Master's degree (e.g., MA, MS, MEng, MEd, MSW, MBA)  Occupational History    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  Tobacco Use   Smoking status: Never   Smokeless tobacco: Never  Substance and Sexual Activity   Alcohol use: Yes    Comment: occ   Drug use: No   Sexual activity: Not on file  Other Topics Concern   Not on file  Social History Narrative   Lives with family   Caffeine- coffee 1 daily   Social Determinants of Health   Financial Resource Strain: Not on file  Food Insecurity: Not on file  Transportation Needs: Not on file  Physical Activity: Not on file  Stress: Not on file  Social Connections: Not on file    Her Allergies Are:  Allergies  Allergen Reactions   Erythromycin Nausea Only   Penicillins Rash  :   Her Current Medications Are:  Outpatient Encounter Medications as of 10/12/2021  Medication Sig   amphetamine-dextroamphetamine (ADDERALL) 30 MG tablet Take 30 mg by mouth daily.   cetirizine (ZYRTEC) 10 MG tablet Take 10 mg by mouth daily.   Cyanocobalamin (B-12 PO) Take by mouth daily.   diphenhydrAMINE (BENADRYL) 12.5 MG chewable tablet Chew 12.5 mg by mouth 4 (four) times daily as  needed for allergies. 1-2 tabs as needed   Ferrous Sulfate (IRON PO) Take by mouth daily.   fluticasone (FLONASE) 50 MCG/ACT nasal spray Place 2 sprays into both nostrils daily.   hydrOXYzine (ATARAX) 50 MG tablet Take 1 tablet (50 mg total) by mouth 3 (three) times daily  as needed for anxiety.   metFORMIN (GLUCOPHAGE-XR) 500 MG 24 hr tablet Take 500 mg by mouth daily.   No facility-administered encounter medications on file as of 10/12/2021.  :  Review of Systems:  Out of a complete 14 point review of systems, all are reviewed and negative with the exception of these symptoms as listed below:  Review of Systems  Neurological:        Pt is here for sleep consult   pt states  she snores ,fatigue,headaches  Pt denies hypertension  Pt states she had a slep study 10 years ago but didn't qualify for CPAP machine    ESS:11 FSS:50    Objective:  Neurological Exam  Physical Exam Physical Examination:   Vitals:   10/12/21 0737  BP: 121/79  Pulse: 79    General Examination: The patient is a very pleasant 46 y.o. female in no acute distress. She appears well-developed and well-nourished and well groomed.   HEENT: Normocephalic, atraumatic, pupils are equal, round and reactive to light, extraocular tracking is good without limitation to gaze excursion or nystagmus noted.  Corrective eyeglasses in place.  Hearing is grossly intact. Face is symmetric with normal facial animation. Speech is clear with no dysarthria noted. There is no hypophonia. There is no lip, neck/head, jaw or voice tremor. Neck is supple with full range of passive and active motion. There are no carotid bruits on auscultation. Oropharynx exam reveals: mild mouth dryness, adequate dental hygiene and mild airway crowding, due to small airway entry, uvula is slim but slightly elongated, tonsils small, neck circumference 14-1/4 inches, Mallampati class II.  Tongue protrudes centrally and palate elevates symmetrically.  She has a  minimal overbite.   Chest: Clear to auscultation without wheezing, rhonchi or crackles noted.   Heart: S1+S2+0, regular and normal without murmurs, rubs or gallops noted.    Abdomen: Soft, non-tender and non-distended.   Extremities: There is no pitting edema in the distal lower extremities bilaterally.    Skin: Warm and dry without trophic changes noted.    Musculoskeletal: exam reveals no obvious joint deformities.    Neurologically:  Mental status: The patient is awake, alert and oriented in all 4 spheres. Her immediate and remote memory, attention, language skills and fund of knowledge are appropriate. There is no evidence of aphasia, agnosia, apraxia or anomia. Speech is clear with normal prosody and enunciation. Thought process is linear. Mood is normal and affect is normal.  Cranial nerves II - XII are as described above under HEENT exam.  Motor exam: Normal bulk, strength and tone is noted. There is no obvious tremor.  Fine motor skills and coordination: grossly intact.  Cerebellar testing: No dysmetria or intention tremor. There is no truncal or gait ataxia.  Sensory exam: intact to light touch in the upper and lower extremities.  Gait, station and balance: She stands easily. No veering to one side is noted. No leaning to one side is noted. Posture is age-appropriate and stance is narrow based. Gait shows normal stride length and normal pace. No problems turning are noted.    Assessment and Plan:  In summary, Courtney Medina is a 46 year old female with an underlying medical history of allergies, ADD, asthma, and severe obesity, whose history and physical exam are concerning for obstructive sleep apnea (OSA). I explained the risks and ramifications of untreated moderate to severe OSA, especially with respect to developing cardiovascular disease down the Road, including congestive heart failure, difficult to treat hypertension, cardiac arrhythmias,  or stroke. Even type 2 diabetes has, in  part, been linked to untreated OSA. Symptoms of untreated OSA include daytime sleepiness, memory problems, mood irritability and mood disorder such as depression and anxiety, lack of energy, as well as recurrent headaches, especially morning headaches. We talked about trying to maintain a healthy lifestyle in general, as well as the importance of weight control. We also talked about the importance of good sleep hygiene. I recommended the following at this time: sleep study.  I outlined the differences between a laboratory attended sleep study versus home sleep testing. I explained the sleep test procedure to the patient and also outlined possible surgical and non-surgical treatment options of OSA, including the use of a custom-made dental device (which would require a referral to a specialist dentist or oral surgeon), upper airway surgical options, such as traditional UPPP or a novel less invasive surgical option in the form of Inspire hypoglossal nerve stimulation (which would involve a referral to an ENT surgeon). I also explained the CPAP treatment option to the patient, who indicated that she would be willing to try CPAP if the need arises. I explained the importance of being compliant with PAP treatment, not only for insurance purposes but primarily to improve Her symptoms, and for the patient's long term health benefit, including to reduce her cardiovascular risks.  For her episodic weakness, she is encouraged to make a follow-up appointment with her primary neurologist, Dr. Marjory Lies.  She is agreeable.  She is furthermore encouraged to talk to her primary care about weight management and options for her such as meeting with a nutritionist or referral to a weight management clinic if feasible.  Of note, she is on Adderall but uses this as needed only. I answered all her questions today and the patient was in agreement. I plan to see her back after the sleep study is completed and encouraged her to call  with any interim questions, concerns, problems or updates.  I spent 40 minutes in total face-to-face time and in reviewing records during pre-charting, more than 50% of which was spent in counseling and coordination of care, reviewing test results, reviewing medications and treatment regimen and/or in discussing or reviewing the diagnosis of OSA, the prognosis and treatment options. Pertinent laboratory and imaging test results that were available during this visit with the patient were reviewed by me and considered in my medical decision making (see chart for details).

## 2021-10-13 ENCOUNTER — Telehealth: Payer: Self-pay | Admitting: Neurology

## 2021-10-13 NOTE — Telephone Encounter (Signed)
[place]- [employer] [place] no auth req. Patient is scheduled at [facility] for 10/19/21 at 9 pm.

## 2021-10-19 NOTE — Telephone Encounter (Signed)
The sleep tech called out for the night 10/19/21. I spoke with the patient and r/s her for 11/01/21 at 8pm,.

## 2021-11-01 ENCOUNTER — Ambulatory Visit (INDEPENDENT_AMBULATORY_CARE_PROVIDER_SITE_OTHER): Payer: BC Managed Care – PPO | Admitting: Neurology

## 2021-11-01 DIAGNOSIS — R0683 Snoring: Secondary | ICD-10-CM

## 2021-11-01 DIAGNOSIS — G4719 Other hypersomnia: Secondary | ICD-10-CM

## 2021-11-01 DIAGNOSIS — G4733 Obstructive sleep apnea (adult) (pediatric): Secondary | ICD-10-CM

## 2021-11-01 DIAGNOSIS — Z820 Family history of epilepsy and other diseases of the nervous system: Secondary | ICD-10-CM

## 2021-11-01 DIAGNOSIS — G4720 Circadian rhythm sleep disorder, unspecified type: Secondary | ICD-10-CM

## 2021-11-01 DIAGNOSIS — R635 Abnormal weight gain: Secondary | ICD-10-CM

## 2021-11-01 DIAGNOSIS — R519 Headache, unspecified: Secondary | ICD-10-CM

## 2021-11-03 NOTE — Addendum Note (Signed)
Addended by: Huston Foley on: 11/03/2021 05:22 PM   Modules accepted: Orders

## 2021-11-03 NOTE — Procedures (Signed)
PATIENT'S NAME:  Courtney Medina, Courtney Medina DOB:      Jul 10, 1975      MR#:    [id number]     DATE OF RECORDING: 11/01/2021 REFERRING M.D.:  Burnett Kanaris, [place] Study Performed:   Baseline Polysomnogram HISTORY: 46 year old woman with a history of allergies, asthma, and obesity, who presents for evaluation of her sleep disturbance. The patient endorsed the Epworth Sleepiness Scale at 11 points. The patient's weight 192 pounds with a height of 58 (inches), resulting in a BMI of 40.3 kg/m2. The patient's neck circumference measured 14-1/4 inches.  CURRENT MEDICATIONS: Adderall, Zyrtec, B-12, Benadryl, Iron, Flonase, Atarax, Glucophage-XR   PROCEDURE:  This is a multichannel digital polysomnogram utilizing the Somnostar 11.2 system.  Electrodes and sensors were applied and monitored per AASM Specifications.   EEG, EOG, Chin and Limb EMG, were sampled at 200 Hz.  ECG, Snore and Nasal Pressure, Thermal Airflow, Respiratory Effort, CPAP Flow and Pressure, Oximetry was sampled at 50 Hz. Digital video and audio were recorded.      BASELINE STUDY  Lights Out was at 20:43 and Lights On at 04:58.  Total recording time (TRT) was 496 minutes, with a total sleep time (TST) of 378 minutes.   The patient's sleep latency was 108.5 minutes, which is delayed. REM latency was 129.5 minutes, which is delayed.  The sleep efficiency was 76.2 %.     SLEEP ARCHITECTURE: WASO (Wake after sleep onset) was 16.5 minutes with minimal to mild sleep fragmentation noted.  There were 6.5 minutes in Stage N1, 169 minutes Stage N2, 136.5 minutes Stage N3 and 66 minutes in Stage REM.  The percentage of Stage N1 was 1.7%, Stage N2 was 44.7%, Stage N3 was 36.1%, which is increased, and Stage R (REM sleep) was 17.5%, which is mildly reduced. The arousals were noted as: 22 were spontaneous, 0 were associated with PLMs, 5 were associated with respiratory events.  RESPIRATORY ANALYSIS:  There were a total of 94 respiratory events:  7 obstructive  apneas, 2 central apneas and 0 mixed apneas with a total of 9 apneas and an apnea index (AI) of 1.4 /hour. There were 85 hypopneas with a hypopnea index of 13.5 /hour. The patient also had 0 respiratory event related arousals (RERAs).      The total APNEA/HYPOPNEA INDEX (AHI) was 14.9/hour and the total RESPIRATORY DISTURBANCE INDEX was  14.9 /hour.  46 events occurred in REM sleep and 90 events in NREM. The REM AHI was  41.8 /hour, versus a non-REM AHI of 9.2. The patient spent 109 minutes of total sleep time in the supine position and 269 minutes in non-supine.. The supine AHI was 27.6 versus a non-supine AHI of 9.8.  OXYGEN SATURATION & C02:  The Wake baseline 02 saturation was 96%, with the lowest being 78%. Time spent below 89% saturation equaled 14 minutes.  PERIODIC LIMB MOVEMENTS: The patient had a total of 0 Periodic Limb Movements.  The Periodic Limb Movement (PLM) index was 0 and the PLM Arousal index was 0/hour. Audio and video analysis did not show any abnormal or unusual movements, behaviors, phonations or vocalizations. The patient took 1 bathroom break. Mild to moderate snoring was noted. The EKG was in keeping with normal sinus rhythm (NSR).  Post-study, the patient indicated that sleep was the same as usual.   IMPRESSION:  Obstructive Sleep Apnea (OSA) Dysfunctions associated with sleep stages or arousal from sleep  RECOMMENDATIONS:  This study demonstrates overall mild/near-moderate obstructive sleep apnea, severe in REM sleep  with a total AHI of 14.9/hour, REM AHI of 41.8/hour, supine AHI of 27.6/hour and O2 nadir of 78% (during supine REM sleep). Given the patient's medical history and sleep related complaints, treatment with positive airway pressure is recommended; this can be achieved in the form of autoPAP. Alternatively, a full-night CPAP titration study would allow optimization of therapy if needed. Other treatment options may include avoidance of supine sleep position  along with weight loss, or the use of an oral appliance in selected patients.  Please note that untreated obstructive sleep apnea may carry additional perioperative morbidity. Patients with significant obstructive sleep apnea should receive perioperative PAP therapy and the surgeons and particularly the anesthesiologist should be informed of the diagnosis and the severity of the sleep disordered breathing. This study shows sleep fragmentation and abnormal sleep stage percentages; these are nonspecific findings and per se do not signify an intrinsic sleep disorder or a cause for the patient's sleep-related symptoms. Causes include (but are not limited to) the first night effect of the sleep study, circadian rhythm disturbances, medication effect or an underlying mood disorder or medical problem.  The patient should be cautioned not to drive, work at heights, or operate dangerous or heavy equipment when tired or sleepy. Review and reiteration of good sleep hygiene measures should be pursued with any patient. The patient will be seen in follow-up by Dr. Frances Furbish at [facility] for discussion of the test results and further management strategies. The referring provider will be notified of the test results.  I certify that I have reviewed the entire raw data recording prior to the issuance of this report in accordance with the Standards of Accreditation of the American Academy of Sleep Medicine (AASM)  Huston Foley, MD, PhD Diplomat, American Board of Neurology and Sleep Medicine (Neurology and Sleep Medicine)

## 2021-11-08 ENCOUNTER — Telehealth: Payer: Self-pay

## 2021-11-08 NOTE — Telephone Encounter (Signed)
Attempted to reach the pt received message stating the pt was not available and could not accept new messages.  Will try and follow up at a later time.

## 2021-11-08 NOTE — Telephone Encounter (Signed)
-----   Message from Huston Foley, MD sent at 11/03/2021  5:22 PM EDT ----- Patient referred by Dr. Marjory Lies, seen by me on 10/12/21, diagnostic PSG on 11/01/21.    Please call and notify the patient that the recent sleep study showed near-moderate obstructive sleep apnea, severe in REM sleep with a total AHI of 14.9/hour, REM AHI of 41.8/hour, supine AHI of 27.6/hour and O2 nadir of 78% (during supine REM sleep). I recommend treatment in the form of autoPAP, which means, that we don't have to bring her back for a second sleep study with CPAP, but will let him try an autoPAP machine at home, through a DME company (of her choice, or as per insurance requirement). The DME representative will educate her on how to use the machine, how to put the mask on, etc. I have placed an order in the chart. Please send referral, talk to patient, send report to referring MD. We will need a FU in sleep clinic for 10 weeks post-PAP set up, please arrange that with me or one of our NPs. Thanks,   Huston Foley, MD, PhD [facility])

## 2021-11-16 NOTE — Telephone Encounter (Signed)
Second Attempt. Attempted to reach the pt received message stating the pt was not available and could not accept new messages.   Will try and follow up at a later time.

## 2021-11-17 ENCOUNTER — Encounter: Payer: Self-pay | Admitting: *Deleted

## 2021-11-22 NOTE — Telephone Encounter (Signed)
Pt returning phone call. Would like a call from the nurse. 

## 2021-11-22 NOTE — Telephone Encounter (Signed)
Third Attempt. Attempted to reach the pt received message stating the call could not be completed.

## 2021-11-23 ENCOUNTER — Encounter: Payer: Self-pay | Admitting: *Deleted

## 2021-11-23 NOTE — Telephone Encounter (Signed)
Spoke with patient and discussed sleep study results as noted below by Dr Frances Furbish. Patient verbalized understanding and she is amenable to proceeding with autopap. Discussed options for DME company. Will use [facility] ([employer]). Pt understands she should receive a call from them within 1 week to discuss financials and appt for setup. She understands insurance compliance requirements which includes using the machine for at least 4 hours at night and also being seen in the office between 30 and 90 days after set up.  Patient scheduled an initial follow-up for October 19 at 3:45 PM arrival 15 to 30 minutes early with machine and power cord.  Her questions were answered during the call.  She will reach out to [facility] if she does not hear from them within 1 week.  Confirmed patient's address for follow-up letter.   Order, office note, sleep study result, insurance info faxed to [facility]). Received a receipt of confirmation. F/u letter mailed to pt.

## 2021-11-25 NOTE — Telephone Encounter (Signed)
Received a fax from [facility] stating order for CPAP has been received and is being processed.

## 2021-12-03 NOTE — Telephone Encounter (Signed)
Pt called stating that she called [employer] and they informed her that they never received any information for her. Pt was informed of RN note that they had received the CPAP order and that it was being processed. Pt stated that she will reach out to them and have them look again.

## 2021-12-06 NOTE — Telephone Encounter (Signed)
I will call them this morning as well. They had reached out asking for pt's phone number which I had included on the original referral. I sent them her phone number again so I will follow-up.

## 2021-12-06 NOTE — Telephone Encounter (Signed)
I called [employer] and spoke with [place]. She states the pt had called last Friday and is now scheduled for the 16th (? or 19th).

## 2022-01-26 ENCOUNTER — Telehealth: Payer: Self-pay | Admitting: Adult Health

## 2022-01-26 NOTE — Telephone Encounter (Signed)
Unable to LVM, sent mychart msg informing pt of r/s needed for 10/19 appt- NP out.

## 2022-02-10 ENCOUNTER — Ambulatory Visit: Payer: BC Managed Care – PPO | Admitting: Adult Health

## 2022-03-15 ENCOUNTER — Encounter (HOSPITAL_BASED_OUTPATIENT_CLINIC_OR_DEPARTMENT_OTHER): Payer: Self-pay

## 2022-03-15 ENCOUNTER — Emergency Department (HOSPITAL_BASED_OUTPATIENT_CLINIC_OR_DEPARTMENT_OTHER)
Admission: EM | Admit: 2022-03-15 | Discharge: 2022-03-15 | Disposition: A | Discharge status: Home / Self Care | Payer: BC Managed Care – PPO | Attending: Emergency Medicine | Admitting: Emergency Medicine

## 2022-03-15 ENCOUNTER — Emergency Department (HOSPITAL_BASED_OUTPATIENT_CLINIC_OR_DEPARTMENT_OTHER): Payer: BC Managed Care – PPO

## 2022-03-15 DIAGNOSIS — M25511 Pain in right shoulder: Secondary | ICD-10-CM | POA: Diagnosis not present

## 2022-03-15 DIAGNOSIS — Y92410 Unspecified street and highway as the place of occurrence of the external cause: Secondary | ICD-10-CM | POA: Diagnosis not present

## 2022-03-15 DIAGNOSIS — M79644 Pain in right finger(s): Secondary | ICD-10-CM | POA: Insufficient documentation

## 2022-03-15 DIAGNOSIS — Z7984 Long term (current) use of oral hypoglycemic drugs: Secondary | ICD-10-CM | POA: Diagnosis not present
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
## 2022-03-15 NOTE — ED Notes (Signed)
Discharge instructions reviewed with patient. Patient verbalizes understanding, no further questions at this time. Medications and follow up information provided. No acute distress noted at time of departure.  
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
## 2022-08-08 ENCOUNTER — Emergency Department (HOSPITAL_BASED_OUTPATIENT_CLINIC_OR_DEPARTMENT_OTHER): Payer: BC Managed Care – PPO

## 2022-08-08 ENCOUNTER — Emergency Department (HOSPITAL_BASED_OUTPATIENT_CLINIC_OR_DEPARTMENT_OTHER)
Admission: EM | Admit: 2022-08-08 | Discharge: 2022-08-08 | Disposition: A | Discharge status: Home / Self Care | Payer: BC Managed Care – PPO | Attending: Emergency Medicine | Admitting: Emergency Medicine

## 2022-08-08 ENCOUNTER — Encounter (HOSPITAL_BASED_OUTPATIENT_CLINIC_OR_DEPARTMENT_OTHER): Payer: Self-pay | Admitting: Emergency Medicine

## 2022-08-08 DIAGNOSIS — M5432 Sciatica, left side: Secondary | ICD-10-CM | POA: Insufficient documentation

## 2022-08-08 DIAGNOSIS — J45909 Unspecified asthma, uncomplicated: Secondary | ICD-10-CM | POA: Insufficient documentation

## 2022-08-08 MED ORDER — ONDANSETRON 4 MG PO TBDP
8.0000 mg | ORAL_TABLET | Freq: Once | ORAL | Status: AC
  Administered 2022-08-08: 8 mg via ORAL
  Filled 2022-08-08: qty 2

## 2022-08-08 MED ORDER — HYDROMORPHONE HCL 1 MG/ML IJ SOLN
2.0000 mg | Freq: Once | INTRAMUSCULAR | Status: AC
  Administered 2022-08-08: 2 mg via INTRAMUSCULAR
  Filled 2022-08-08: qty 2

## 2022-08-08 MED ORDER — KETOROLAC TROMETHAMINE 15 MG/ML IJ SOLN
15.0000 mg | Freq: Once | INTRAMUSCULAR | Status: AC
  Administered 2022-08-08: 15 mg via INTRAMUSCULAR
  Filled 2022-08-08: qty 1
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
## 2022-08-08 NOTE — ED Notes (Signed)
Pt's mother came out to question staff about her condition. Mother wanted to know why the pt had not received blood labs, and why the ED had not fixed her pain. It was explained that the pt has an issue that most likely needs a Neurology consult and that the ED cannot always fix every problem. The mother wanted to know why we were taking her off of Oxygen, which was explained. She also wanted to know what we were going to do for her chronic pain and this Medic explained she needs to seek out her PCP and Speciality care to establish a care plan. The mother returned to the room.

## 2022-08-08 NOTE — Discharge Instructions (Signed)

## 2022-08-08 NOTE — ED Triage Notes (Signed)
Pt c/o left leg pain and lower back pain that started a couple of days ago. Pt reports pain got worse when she got up this morning to go to work. Pt denies any injury.

## 2022-08-08 NOTE — ED Notes (Signed)
O2 dropped to 0.5 liters to see how the patient compensates. Will do a room air challenge. Pt CAOx4, NAD in the bed with family nearby.

## 2022-08-08 NOTE — ED Notes (Signed)
Pt provided with work note and provided wheelchair for departure. Ambulatory without assistance into wheelchair and vehicle. Mother driving patient home.

## 2022-08-08 NOTE — ED Notes (Signed)
Pt on room air now for assessment without O2.

## 2022-08-08 NOTE — ED Provider Notes (Signed)
Received patient in turnover from Dr. Read Drivers.  Please see their note for further details of Hx, PE.  Briefly patient is a 47 y.o. female with a Leg Pain .  47 yo F with a chief complaints of what sounds like radicular lower back pain.  Awaiting CT imaging.  Patient also had hypoxia upon narcotic administration.  Plan to watch in the ER.  CT scan has resulted and is negative for fracture or other possible acute pathology.  I discussed results with the patient.  She has some difficulty grasping the cause of her discomfort.  Requested further imaging as well as blood work.  A long discussion with her about how I did not think this would be helpful.  Encouraged her to follow-up with her doctor in the office.      Melene Plan, DO 08/08/22 [phone]

## 2022-08-08 NOTE — ED Notes (Signed)
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

## 2022-08-08 NOTE — ED Notes (Signed)
Oxygen sats dropped to 74% on room air Pt placed on 2 liters of oxygen via nasal canula

## 2023-10-04 IMAGING — DX DG CHEST 2V
2 series · 2 of 2 positions shown · non-contrast
Comparison: 07/02/2020

CLINICAL DATA: Shortness of breath. Abdominal pain. Nausea, and
loose stools.

EXAM:
CHEST - 2 VIEW

[chest pa]
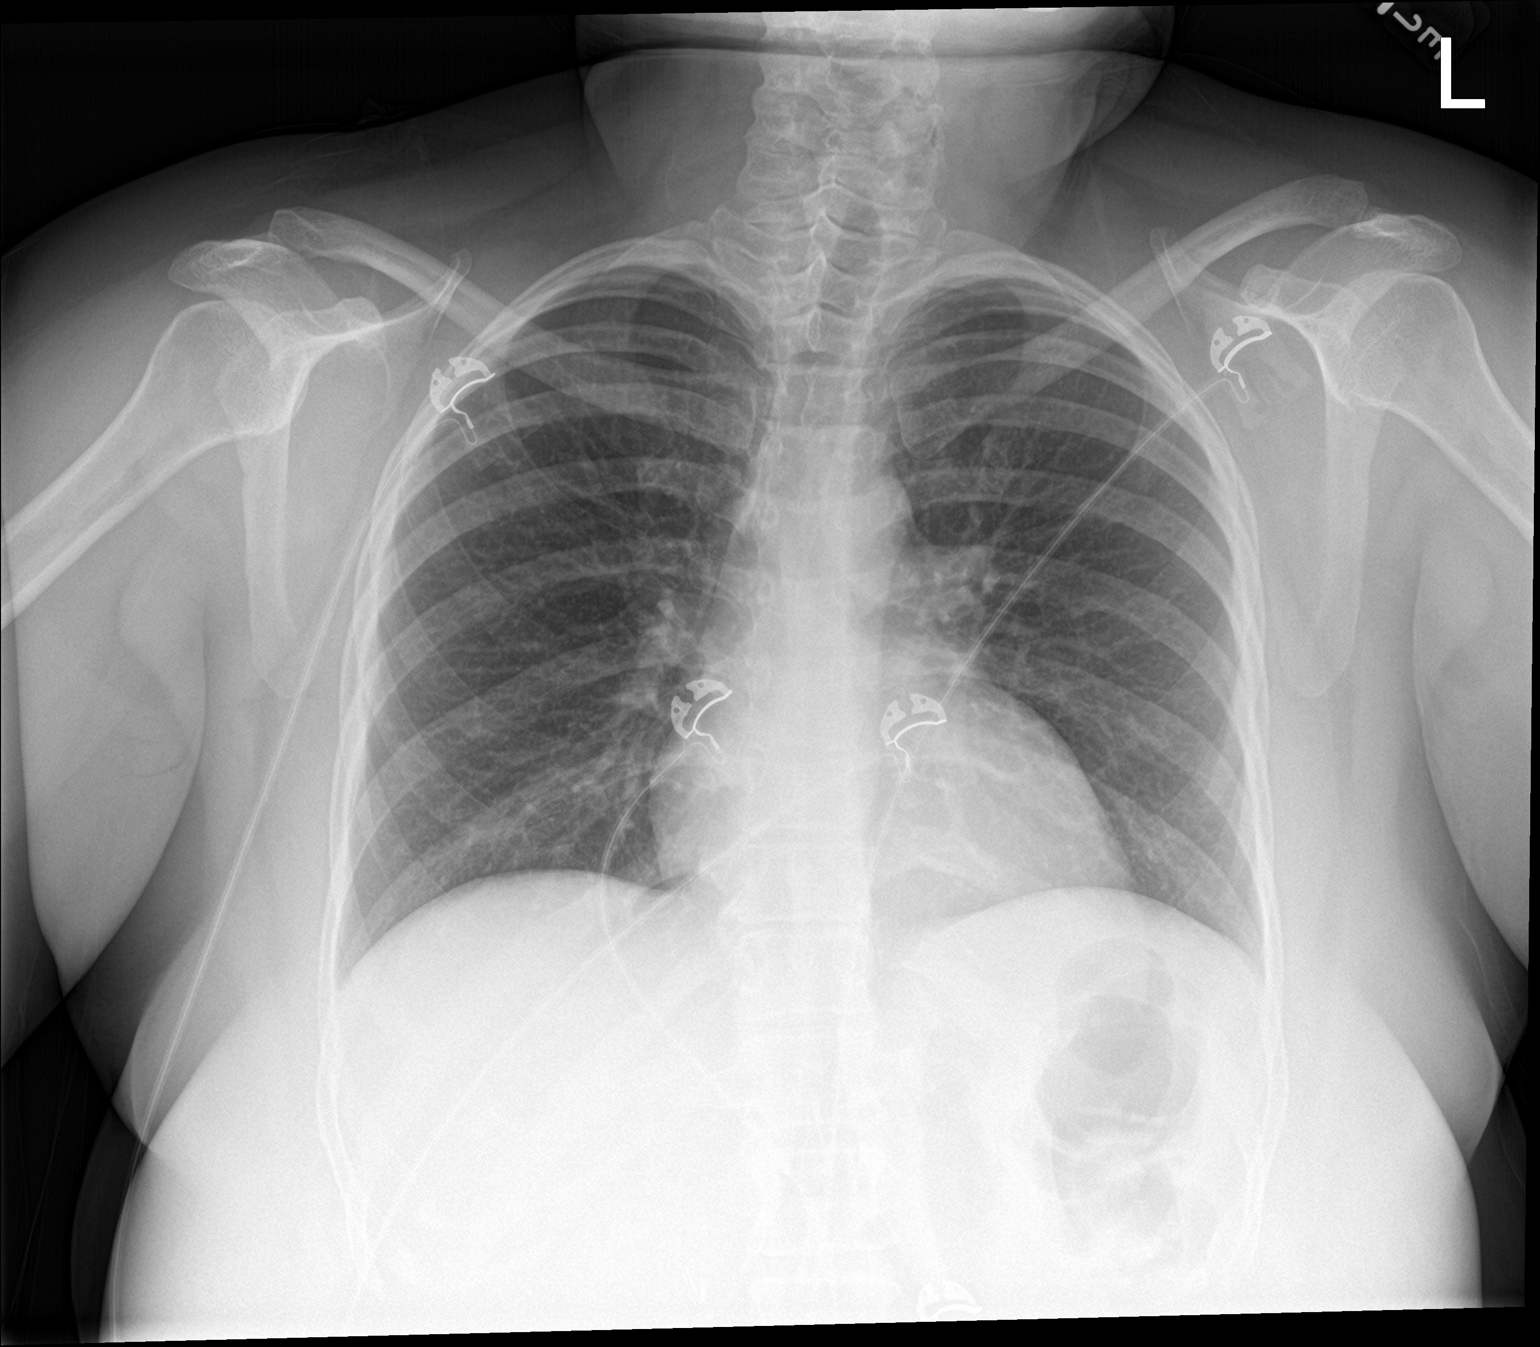

[chest lat]
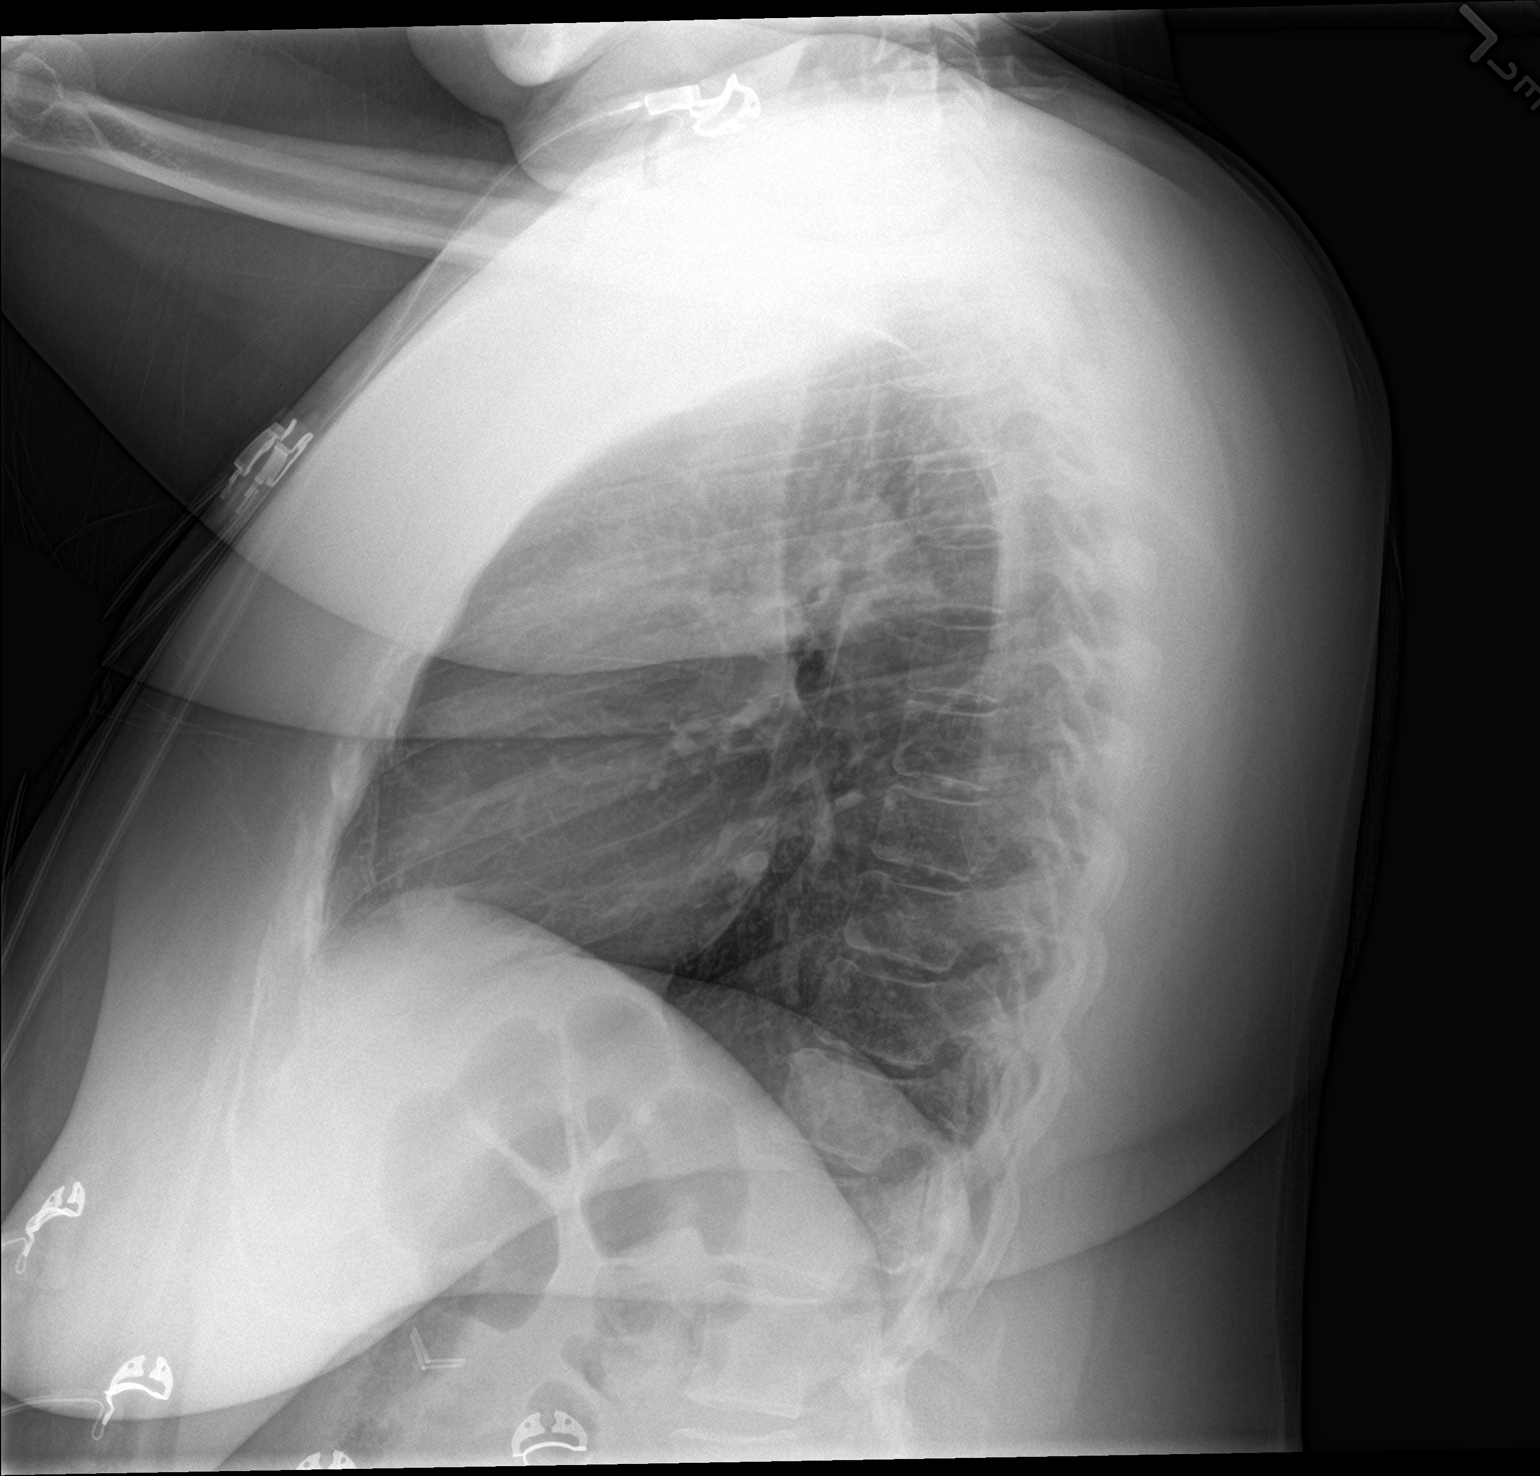

[2 of 2 positions shown; findings below may reference images not displayed]

FINDINGS: The heart size and mediastinal contours are within normal limits.
Both lungs are clear. The visualized skeletal structures are
unremarkable.
IMPRESSION: No active cardiopulmonary disease.
